# Patient Record
Sex: Male | Born: 1980 | Race: Black or African American | Hispanic: No | Marital: Married | Smoking: Former smoker
Health system: Southern US, Community
[De-identification: ages and names within clinical notes are randomized; demographics above are authoritative.]

## PROBLEM LIST (undated history)

## (undated) DIAGNOSIS — K047 Periapical abscess without sinus: Secondary | ICD-10-CM

---

## 2019-05-04 ENCOUNTER — Emergency Department
Admission: EM | Admit: 2019-05-04 | Discharge: 2019-05-04 | Disposition: A | Discharge status: Home / Self Care | Payer: No Typology Code available for payment source | Attending: Student in an Organized Health Care Education/Training Program | Admitting: Student in an Organized Health Care Education/Training Program

## 2019-05-04 ENCOUNTER — Other Ambulatory Visit: Payer: Self-pay

## 2019-05-04 ENCOUNTER — Emergency Department: Payer: No Typology Code available for payment source

## 2019-05-04 DIAGNOSIS — S20212A Contusion of left front wall of thorax, initial encounter: Secondary | ICD-10-CM | POA: Diagnosis not present

## 2019-05-04 DIAGNOSIS — M79604 Pain in right leg: Secondary | ICD-10-CM | POA: Diagnosis not present

## 2019-05-04 DIAGNOSIS — Y92481 Parking lot as the place of occurrence of the external cause: Secondary | ICD-10-CM | POA: Diagnosis not present

## 2019-05-04 DIAGNOSIS — M79609 Pain in unspecified limb: Secondary | ICD-10-CM

## 2019-05-04 DIAGNOSIS — Y9301 Activity, walking, marching and hiking: Secondary | ICD-10-CM | POA: Diagnosis not present

## 2019-05-04 DIAGNOSIS — Y999 Unspecified external cause status: Secondary | ICD-10-CM | POA: Diagnosis not present

## 2019-05-04 DIAGNOSIS — S299XXA Unspecified injury of thorax, initial encounter: Secondary | ICD-10-CM | POA: Diagnosis present

## 2019-05-04 DIAGNOSIS — F17200 Nicotine dependence, unspecified, uncomplicated: Secondary | ICD-10-CM | POA: Insufficient documentation

## 2019-05-04 MED ORDER — CYCLOBENZAPRINE HCL 10 MG PO TABS: 10.0000 mg | ORAL_TABLET | Freq: Three times a day (TID) | ORAL | 0 refills | Status: DC | PRN

## 2019-05-04 MED ORDER — NAPROXEN 500 MG PO TABS
500.0000 mg | ORAL_TABLET | Freq: Once | ORAL | Status: AC
  Administered 2019-05-04: 500 mg via ORAL
  Filled 2019-05-04: qty 1

## 2019-05-04 MED ORDER — CYCLOBENZAPRINE HCL 10 MG PO TABS
10.0000 mg | ORAL_TABLET | Freq: Once | ORAL | Status: AC
  Administered 2019-05-04: 10 mg via ORAL
  Filled 2019-05-04: qty 1

## 2019-05-04 MED ORDER — NAPROXEN 500 MG PO TABS: 500.0000 mg | ORAL_TABLET | Freq: Two times a day (BID) | ORAL | 0 refills | Status: DC
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
## 2019-05-04 NOTE — ED Notes (Signed)
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
## 2020-04-12 ENCOUNTER — Emergency Department: Payer: No Typology Code available for payment source

## 2020-04-12 ENCOUNTER — Encounter: Payer: Self-pay | Admitting: Emergency Medicine

## 2020-04-12 ENCOUNTER — Other Ambulatory Visit: Payer: Self-pay

## 2020-04-12 ENCOUNTER — Emergency Department
Admission: EM | Admit: 2020-04-12 | Discharge: 2020-04-12 | Disposition: A | Discharge status: Home / Self Care | Payer: No Typology Code available for payment source | Attending: Emergency Medicine | Admitting: Emergency Medicine

## 2020-04-12 DIAGNOSIS — R609 Edema, unspecified: Secondary | ICD-10-CM | POA: Insufficient documentation

## 2020-04-12 DIAGNOSIS — K047 Periapical abscess without sinus: Secondary | ICD-10-CM | POA: Insufficient documentation

## 2020-04-12 DIAGNOSIS — F17200 Nicotine dependence, unspecified, uncomplicated: Secondary | ICD-10-CM | POA: Diagnosis not present

## 2020-04-12 DIAGNOSIS — K0889 Other specified disorders of teeth and supporting structures: Secondary | ICD-10-CM | POA: Diagnosis present

## 2020-04-12 HISTORY — DX: Periapical abscess without sinus: K04.7

## 2020-04-12 LAB — CBC WITH DIFFERENTIAL/PLATELET
Abs Immature Granulocytes: 0.04 10*3/uL (ref 0.00–0.07)
Basophils Absolute: 0 10*3/uL (ref 0.0–0.1)
Basophils Relative: 0 %
Eosinophils Absolute: 0.1 10*3/uL (ref 0.0–0.5)
Eosinophils Relative: 0 %
HCT: 45.6 % (ref 39.0–52.0)
Hemoglobin: 15.2 g/dL (ref 13.0–17.0)
Immature Granulocytes: 0 %
Lymphocytes Relative: 24 %
Lymphs Abs: 2.8 10*3/uL (ref 0.7–4.0)
MCH: 30.5 pg (ref 26.0–34.0)
MCHC: 33.3 g/dL (ref 30.0–36.0)
MCV: 91.6 fL (ref 80.0–100.0)
Monocytes Absolute: 1.5 10*3/uL — ABNORMAL HIGH (ref 0.1–1.0)
Monocytes Relative: 13 %
Neutro Abs: 7.2 10*3/uL (ref 1.7–7.7)
Neutrophils Relative %: 63 %
Platelets: 206 10*3/uL (ref 150–400)
RBC: 4.98 MIL/uL (ref 4.22–5.81)
RDW: 13 % (ref 11.5–15.5)
WBC: 11.7 10*3/uL — ABNORMAL HIGH (ref 4.0–10.5)
nRBC: 0.3 % — ABNORMAL HIGH (ref 0.0–0.2)

## 2020-04-12 LAB — BASIC METABOLIC PANEL
Anion gap: 10 (ref 5–15)
BUN: 6 mg/dL (ref 6–20)
CO2: 26 mmol/L (ref 22–32)
Calcium: 9.1 mg/dL (ref 8.9–10.3)
Chloride: 102 mmol/L (ref 98–111)
Creatinine, Ser: 1.18 mg/dL (ref 0.61–1.24)
GFR, Estimated: >60 mL/min (ref >60)
Glucose, Bld: 79 mg/dL (ref 70–99)
Potassium: 3.7 mmol/L (ref 3.5–5.1)
Sodium: 138 mmol/L (ref 135–145)

## 2020-04-12 MED ORDER — NAPROXEN 500 MG PO TABS: 500.0000 mg | ORAL_TABLET | Freq: Two times a day (BID) | ORAL | Status: DC

## 2020-04-12 MED ORDER — LIDOCAINE VISCOUS HCL 2 % MT SOLN
15.0000 mL | Freq: Once | OROMUCOSAL | Status: AC
  Administered 2020-04-12: 15 mL via OROMUCOSAL
  Filled 2020-04-12: qty 15

## 2020-04-12 MED ORDER — NAPROXEN 500 MG PO TABS
500.0000 mg | ORAL_TABLET | Freq: Once | ORAL | Status: AC
  Administered 2020-04-12: 500 mg via ORAL
  Filled 2020-04-12: qty 1

## 2020-04-12 MED ORDER — IOHEXOL 300 MG/ML  SOLN
75.0000 mL | Freq: Once | INTRAMUSCULAR | Status: AC | PRN
  Administered 2020-04-12: 75 mL via INTRAVENOUS
  Filled 2020-04-12: qty 75

## 2020-04-12 MED ORDER — LIDOCAINE VISCOUS HCL 2 % MT SOLN: 5.0000 mL | Freq: Four times a day (QID) | OROMUCOSAL | 0 refills | Status: DC | PRN

## 2020-04-12 NOTE — Discharge Instructions (Signed)
CT did not reveal an abscess.  You have a dental infection which can be treated with oral antibiotics.  Continue previous medication and start naproxen as directed.  Follow-up with treating dentist.

## 2020-04-12 NOTE — ED Provider Notes (Signed)
[facility] Emergency Department Provider Note   ____________________________________________   Event Date/Time   First MD Initiated Contact with Patient 04/12/20 1317     (approximate)  I have reviewed the triage vital signs and the nursing notes.   HISTORY  Chief Complaint Dental Pain    HPI Eric Mata is a 39 y.o. male patient presents with dental pain and facial edema to the left anterior maxillary area.  Patient states saw dentist who started him on antibiotics yesterday.  Patient state waking this morning with increased swelling and pain.         Past Medical History:  Diagnosis Date  . Dental abscess     There are no problems to display for this patient.   History reviewed. No pertinent surgical history.  Prior to Admission medications   Medication Sig Start Date End Date Taking? Authorizing Provider  cyclobenzaprine (FLEXERIL) 10 MG tablet Take 1 tablet (10 mg total) by mouth 3 (three) times daily as needed. 05/04/19   Triplett, Cari B, FNP  lidocaine (XYLOCAINE) 2 % solution Use as directed 5 mLs in the mouth or throat every 6 (six) hours as needed for mouth pain. Oral swish for dental pain 04/12/20   Joni Reining, PA-C  naproxen (NAPROSYN) 500 MG tablet Take 1 tablet (500 mg total) by mouth 2 (two) times daily with a meal. 05/04/19   Triplett, Cari B, FNP  naproxen (NAPROSYN) 500 MG tablet Take 1 tablet (500 mg total) by mouth 2 (two) times daily with a meal. 04/12/20   Joni Reining, PA-C    Allergies Patient has no known allergies.  History reviewed. No pertinent family history.  Social History Social History   Tobacco Use  . Smoking status: Current Every Day Smoker  . Smokeless tobacco: Never Used  Substance Use Topics  . Alcohol use: Never  . Drug use: Never    Review of Systems Constitutional: No fever/chills ENT: No sore throat.  Dental pain and swelling Cardiovascular: Denies chest  pain. Respiratory: Denies shortness of breath. Gastrointestinal: No abdominal pain.  No nausea, no vomiting.  No diarrhea.  No constipation. Genitourinary: Negative for dysuria. Skin: Negative for rash. Neurological: Negative for headaches, focal weakness or numbness.   ____________________________________________   PHYSICAL EXAM:  VITAL SIGNS: ED Triage Vitals  Enc Vitals Group     BP 04/12/20 1301 (!) 141/85     Pulse Rate 04/12/20 1301 94     Resp 04/12/20 1301 16     Temp 04/12/20 1301 98.6 F (37 C)     Temp Source 04/12/20 1301 Oral     SpO2 04/12/20 1301 100 %     Weight 04/12/20 1303 165 lb (74.8 kg)     Height 04/12/20 1303 6' (1.829 m)     Head Circumference --      Peak Flow --      Pain Score 04/12/20 1303 3     Pain Loc --      Pain Edu? --      Excl. in GC? --     Constitutional: Alert and oriented. Well appearing and in no acute distress. Eyes: Conjunctivae are normal. PERRL. EOMI. Head: Atraumatic. Nose: No congestion/rhinnorhea. Mouth/Throat: Mucous membranes are moist.  Oropharynx non-erythematous.  Multiple fraction devitalized teeth.  Gingiva edema. Neck: No stridor. Hematological/Lymphatic/Immunilogical: No cervical lymphadenopathy. Cardiovascular: Normal rate, regular rhythm. Grossly normal heart sounds.  Good peripheral circulation. Respiratory: Normal respiratory effort.  No retractions. Lungs CTAB. ____________________________________________  LABS (all labs ordered are listed, but only abnormal results are displayed)  Labs Reviewed  CBC WITH DIFFERENTIAL/PLATELET - Abnormal; Notable for the following components:      Result Value   WBC 11.7 (*)    nRBC 0.3 (*)    Monocytes Absolute 1.5 (*)    All other components within normal limits  BASIC METABOLIC PANEL   ____________________________________________  EKG   ____________________________________________  RADIOLOGY I, Joni Reining, personally viewed and evaluated these images  (plain radiographs) as part of my medical decision making, as well as reviewing the written report by the radiologist.  ED MD interpretation:    Official radiology report(s): CT Maxillofacial W Contrast  Result Date: 04/12/2020 CLINICAL DATA:  Dental abscess on the left EXAM: CT MAXILLOFACIAL WITH CONTRAST TECHNIQUE: Multidetector CT imaging of the maxillofacial structures was performed with intravenous contrast. Multiplanar CT image reconstructions were also generated. CONTRAST:  51mL OMNIPAQUE IOHEXOL 300 MG/ML  SOLN COMPARISON:  None. FINDINGS: Osseous: There is been a previous mandibular fixation on the right. Plate and screws are in place without complicating feature. The patient is missing many teeth. The remaining teeth show a few scattered caries but I do not see evidence of a root abscess. No evidence lateral cortical erosion. Orbits: Normal Sinuses: Mild mucosal thickening along the maxillary sinus floors. Otherwise negative. Soft tissues: Soft tissue swelling of the left cheek and face consistent with cellulitis. There may be some early phlegmonous inflammation but I do not see a discrete soft tissue abscess. No deep space extension of inflammation. Limited intracranial: Negative IMPRESSION: Soft tissue swelling of the left cheek and face consistent with cellulitis. There may be some early phlegmonous inflammation but I do not see a discrete soft tissue abscess. No deep space extension of inflammation. Few scattered caries but no evidence of advanced periodontal disease or root abscess by CT. Electronically Signed   By: Paulina Fusi M.D.   On: 04/12/2020 15:03    ____________________________________________   PROCEDURES  Procedure(s) performed (including Critical Care):  Procedures   ____________________________________________   INITIAL IMPRESSION / ASSESSMENT AND PLAN / ED COURSE  As part of my medical decision making, I reviewed the following data within the electronic medical  record:          Patient presents with facial edema suspecting an abscess.  Discussed labs and CT results with patient.  Patient complaining physical exam consistent with cellulitis secondary to dental infection but no true abscess.  Patient advised continue previous antibiotics and start naproxen and viscous lidocaine as directed.  Follow-up with treating dentist.      ____________________________________________   FINAL CLINICAL IMPRESSION(S) / ED DIAGNOSES  Final diagnoses:  Dental infection     ED Discharge Orders         Ordered    naproxen (NAPROSYN) 500 MG tablet  2 times daily with meals        04/12/20 1515    lidocaine (XYLOCAINE) 2 % solution  Every 6 hours PRN        04/12/20 1515          *Please note:  Eric Mata was evaluated in Emergency Department on 04/12/2020 for the symptoms described in the history of present illness. He was evaluated in the context of the global COVID-19 pandemic, which necessitated consideration that the patient might be at risk for infection with the SARS-CoV-2 virus that causes COVID-19. Institutional protocols and algorithms that pertain to the evaluation of patients at risk for  COVID-19 are in a state of rapid change based on information released by regulatory bodies including the CDC and federal and state organizations. These policies and algorithms were followed during the patient's care in the ED.  Some ED evaluations and interventions may be delayed as a result of limited staffing during and the pandemic.*   Note:  This document was prepared using Dragon voice recognition software and may include unintentional dictation errors.    Joni Reining, PA-C 04/12/20 1520    Dionne Bucy, MD 04/12/20 [phone]

## 2020-04-12 NOTE — ED Triage Notes (Signed)
Pt comes into the ED via POV c/o dental abscess on the left upper side.  Pt saw the dentist who put him on antibiotics but the patient feels like it is getting worse.  Pt has clear speech and swelling to the left side of his face.  Pt has only taken 1 full day of the antibiotics.

## 2020-04-17 ENCOUNTER — Emergency Department
Admission: EM | Admit: 2020-04-17 | Discharge: 2020-04-17 | Disposition: A | Discharge status: Home / Self Care | Payer: No Typology Code available for payment source | Attending: Emergency Medicine | Admitting: Emergency Medicine

## 2020-04-17 ENCOUNTER — Encounter: Payer: Self-pay | Admitting: Emergency Medicine

## 2020-04-17 ENCOUNTER — Other Ambulatory Visit: Payer: Self-pay

## 2020-04-17 DIAGNOSIS — R220 Localized swelling, mass and lump, head: Secondary | ICD-10-CM | POA: Diagnosis present

## 2020-04-17 DIAGNOSIS — Z87891 Personal history of nicotine dependence: Secondary | ICD-10-CM | POA: Diagnosis not present

## 2020-04-17 DIAGNOSIS — K122 Cellulitis and abscess of mouth: Secondary | ICD-10-CM | POA: Insufficient documentation

## 2020-04-17 MED ORDER — KETOROLAC TROMETHAMINE 30 MG/ML IJ SOLN
30.0000 mg | Freq: Once | INTRAMUSCULAR | Status: AC
  Administered 2020-04-17: 30 mg via INTRAVENOUS
  Filled 2020-04-17: qty 1

## 2020-04-17 MED ORDER — LIDOCAINE-EPINEPHRINE 2 %-1:100000 IJ SOLN
1.7000 mL | Freq: Once | INTRAMUSCULAR | Status: AC
  Administered 2020-04-17: 1.7 mL
  Filled 2020-04-17: qty 1.7

## 2020-04-17 MED ORDER — CLINDAMYCIN PHOSPHATE 600 MG/50ML IV SOLN
600.0000 mg | Freq: Once | INTRAVENOUS | Status: AC
  Administered 2020-04-17: 600 mg via INTRAVENOUS
  Filled 2020-04-17 (×2): qty 50

## 2020-04-17 MED ORDER — CLINDAMYCIN HCL 300 MG PO CAPS: 300.0000 mg | ORAL_CAPSULE | Freq: Three times a day (TID) | ORAL | 0 refills | Status: AC

## 2020-04-17 NOTE — Discharge Instructions (Addendum)
You have had your cheek abscess drained, and got an IV dose of antibiotics in the ED today.  Apply warm compresses to the face and use salt water gargles to help promote healing.  Continue antibiotics by mouth tomorrow, and follow-up with your dental provider for ongoing management.  Return if needed.

## 2020-04-17 NOTE — ED Provider Notes (Signed)
[facility] Emergency Department Provider Note ____________________________________________  Time seen: 2244  I have reviewed the triage vital signs and the nursing notes.  HISTORY  Chief Complaint  Dental Pain   HPI Eric Mata is a 39 y.o. male presents himself to the ED for subsequent evaluation of ongoing swelling to the face and left cheek.  Patient was evaluated here 2 days ago, with a full work-up including a contrast CT study of the head and neck.  He was found to have a nonorganized phlegmon to the left cheek.  He had been started empirically  on Augmentin by cental provider, with plans on a root canal to be performed tomorrow.  Patient returned today after making phone follow-up with a dental provider.  He was told to report to the ED immediately for probable IV antibiotic  Past Medical History:  Diagnosis Date  . Dental abscess     There are no problems to display for this patient.   History reviewed. No pertinent surgical history.  Prior to Admission medications   Medication Sig Start Date End Date Taking? Authorizing Provider  clindamycin (CLEOCIN) 300 MG capsule Take 1 capsule (300 mg total) by mouth 3 (three) times daily for 10 days. 04/17/20 04/27/20  Gabrielle Mester, Charlesetta Ivory, PA-C  cyclobenzaprine (FLEXERIL) 10 MG tablet Take 1 tablet (10 mg total) by mouth 3 (three) times daily as needed. 05/04/19   Triplett, Cari B, FNP  lidocaine (XYLOCAINE) 2 % solution Use as directed 5 mLs in the mouth or throat every 6 (six) hours as needed for mouth pain. Oral swish for dental pain 04/12/20   Joni Reining, PA-C  naproxen (NAPROSYN) 500 MG tablet Take 1 tablet (500 mg total) by mouth 2 (two) times daily with a meal. 05/04/19   Triplett, Cari B, FNP  naproxen (NAPROSYN) 500 MG tablet Take 1 tablet (500 mg total) by mouth 2 (two) times daily with a meal. 04/12/20   Joni Reining, PA-C    Allergies Patient has no known allergies.  History  reviewed. No pertinent family history.  Social History Social History   Tobacco Use  . Smoking status: Former Games developer  . Smokeless tobacco: Never Used  Substance Use Topics  . Alcohol use: Never  . Drug use: Never    Review of Systems  Constitutional: Negative for fever. Eyes: Negative for visual changes. ENT: Negative for sore throat. Facial abscess as above. Cardiovascular: Negative for chest pain. Respiratory: Negative for shortness of breath. Gastrointestinal: Negative for abdominal pain, vomiting and diarrhea. Genitourinary: Negative for dysuria. Musculoskeletal: Negative for back pain. Skin: Negative for rash.   Neurological: Negative for headaches, focal weakness or numbness. ____________________________________________  PHYSICAL EXAM:  VITAL SIGNS: ED Triage Vitals  Enc Vitals Group     BP 04/17/20 2057 111/66     Pulse Rate 04/17/20 2057 60     Resp 04/17/20 2057 16     Temp 04/17/20 2057 98.9 F (37.2 C)     Temp Source 04/17/20 2057 Oral     SpO2 04/17/20 2057 100 %     Weight 04/17/20 2058 170 lb (77.1 kg)     Height 04/17/20 2058 6' (1.829 m)     Head Circumference --      Peak Flow --      Pain Score 04/17/20 2058 4     Pain Loc --      Pain Edu? --      Excl. in GC? --  Constitutional: Alert and oriented. Well appearing and in no distress. Head: Normocephalic and atraumatic. Eyes: Conjunctivae are normal. Normal extraocular movements Mouth/Throat: Mucous membranes are moist.  Patient with no focal gumline swelling or erythema noted.  No brawny sublingual edema is appreciated.  Patient with a focal firm well collected abscess to the left cheek.  No extension or spontaneous drainage into the mouth is noted. Neck: Supple. No thyromegaly. Hematological/Lymphatic/Immunological: No cervical lymphadenopathy. Cardiovascular: Normal rate, regular rhythm. Normal distal pulses. Respiratory: Normal respiratory effort. No  wheezes/rales/rhonchi. Musculoskeletal: Nontender with normal range of motion in all extremities.  Neurologic:  Normal gait without ataxia. Normal speech and language. No gross focal neurologic deficits are appreciated. Skin:  Skin is warm, dry and intact. No rash noted. Psychiatric: Mood and affect are normal. Patient exhibits appropriate insight and judgment. ____________________________________________   LABS (pertinent positives/negatives)  Labs Reviewed  AEROBIC/ANAEROBIC CULTURE (SURGICAL/DEEP WOUND)  ____________________________________________  PROCEDURES  Toradol 30 mg IVP Clindamycin 600 mg IVPB  .Marland KitchenIncision and Drainage  Date/Time: 04/17/2020 11:25 PM Performed by: Lissa Hoard, PA-C Authorized by: Lissa Hoard, PA-C   Consent:    Consent obtained:  Verbal   Consent given by:  Patient   Risks discussed:  Bleeding, pain and incomplete drainage   Alternatives discussed:  Alternative treatment Universal protocol:    Procedure explained and questions answered to patient or proxy's satisfaction: yes     Patient identity confirmed:  Verbally with patient Location:    Type:  Abscess   Location:  Mouth   Mouth location: buccak. Sedation:    Sedation type:  None Anesthesia:    Anesthesia method:  Local infiltration   Local anesthetic:  Lidocaine 2% WITH epi Procedure type:    Complexity:  Simple Procedure details:    Needle aspiration: yes     Needle size:  18 G   Drainage:  Purulent   Drainage amount:  Moderate   Wound treatment:  Wound left open Post-procedure details:    Procedure completion:  Tolerated well, no immediate complications   ____________________________________________  INITIAL IMPRESSION / ASSESSMENT AND PLAN / ED COURSE  Patient with subsequent ED evaluation and management of a focal buccal abscess.  Patient returned at the advice of dental provider for fast forming focal abscess in the cheek.  He was given IV  clindamycin in the ED, and a local needle aspiration procedure was performed.  Patient will be discharged with a prescription for clindamycin to take as directed.  Follow-up with his dental provider for further management of his planned root canal.  Return precautions have been discussed.  Eric Mata was evaluated in Emergency Department on 04/17/2020 for the symptoms described in the history of present illness. He was evaluated in the context of the global COVID-19 pandemic, which necessitated consideration that the patient might be at risk for infection with the SARS-CoV-2 virus that causes COVID-19. Institutional protocols and algorithms that pertain to the evaluation of patients at risk for COVID-19 are in a state of rapid change based on information released by regulatory bodies including the CDC and federal and state organizations. These policies and algorithms were followed during the patient's care in the ED. ____________________________________________  FINAL CLINICAL IMPRESSION(S) / ED DIAGNOSES  Final diagnoses:  Abscess of buccal space of mouth      Karmen Stabs, Charlesetta Ivory, PA-C 04/17/20 2344    Phineas Semen, MD 04/21/20 [phone]

## 2020-04-17 NOTE — ED Triage Notes (Signed)
Pt to ED c/o dental pain left upper mouth.  States was scheduled for root canal tomorrow.  Was started on ABX by dentist augemtin 12/8 and told to return if swelling became worse.  Came to ED the next day and prescribed naproxen.  States back today because swelling getting worse to left upper mouth and cheek and told to have IV ABX.  Pt with clear speech, swelling to left upper mouth, maintaining secretions.  No protocols at this time per Dr. Derrill Kay.

## 2020-04-24 LAB — AEROBIC/ANAEROBIC CULTURE W GRAM STAIN (SURGICAL/DEEP WOUND)

## 2020-11-12 ENCOUNTER — Telehealth: Payer: Self-pay | Admitting: Internal Medicine

## 2020-11-12 ENCOUNTER — Ambulatory Visit: Payer: No Typology Code available for payment source | Admitting: Internal Medicine

## 2020-11-12 ENCOUNTER — Other Ambulatory Visit: Payer: Self-pay

## 2020-11-12 NOTE — Telephone Encounter (Signed)
Pts wife Javonni Macke is calling to place a complaint against [employer] Please advise CB- [phone]

## 2020-11-19 NOTE — Telephone Encounter (Signed)
Spoke with patient's wife.

## 2021-01-12 ENCOUNTER — Encounter: Payer: Self-pay | Admitting: Emergency Medicine

## 2021-01-12 ENCOUNTER — Emergency Department
Admission: EM | Admit: 2021-01-12 | Discharge: 2021-01-12 | Disposition: A | Discharge status: Home / Self Care | Payer: No Typology Code available for payment source | Attending: Emergency Medicine | Admitting: Emergency Medicine

## 2021-01-12 ENCOUNTER — Other Ambulatory Visit: Payer: Self-pay

## 2021-01-12 DIAGNOSIS — M549 Dorsalgia, unspecified: Secondary | ICD-10-CM | POA: Diagnosis not present

## 2021-01-12 DIAGNOSIS — M79602 Pain in left arm: Secondary | ICD-10-CM | POA: Diagnosis not present

## 2021-01-12 DIAGNOSIS — Y92410 Unspecified street and highway as the place of occurrence of the external cause: Secondary | ICD-10-CM | POA: Diagnosis not present

## 2021-01-12 DIAGNOSIS — Z87891 Personal history of nicotine dependence: Secondary | ICD-10-CM | POA: Diagnosis not present

## 2021-01-12 MED ORDER — NAPROXEN 500 MG PO TABS: 500.0000 mg | ORAL_TABLET | Freq: Two times a day (BID) | ORAL | Status: DC

## 2021-01-12 MED ORDER — ORPHENADRINE CITRATE ER 100 MG PO TB12: 100.0000 mg | ORAL_TABLET | Freq: Two times a day (BID) | ORAL | 0 refills | Status: DC

## 2021-01-12 MED ORDER — OXYCODONE-ACETAMINOPHEN 7.5-325 MG PO TABS: 1.0000 | ORAL_TABLET | Freq: Four times a day (QID) | ORAL | 0 refills | Status: DC | PRN
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
## 2021-01-12 NOTE — Discharge Instructions (Signed)
Read and follow discharge care instructions.  Take medication as directed.  Advised medication may cause drowsiness.

## 2021-09-27 ENCOUNTER — Other Ambulatory Visit: Payer: Self-pay

## 2021-09-27 ENCOUNTER — Emergency Department: Payer: No Typology Code available for payment source

## 2021-09-27 ENCOUNTER — Inpatient Hospital Stay
Admission: EM | Admit: 2021-09-27 | Discharge: 2021-09-30 | DRG: 158 | Disposition: A | Discharge status: Home / Self Care | Payer: No Typology Code available for payment source | Attending: Internal Medicine | Admitting: Internal Medicine

## 2021-09-27 DIAGNOSIS — L03211 Cellulitis of face: Secondary | ICD-10-CM | POA: Diagnosis present

## 2021-09-27 DIAGNOSIS — Z87891 Personal history of nicotine dependence: Secondary | ICD-10-CM | POA: Diagnosis not present

## 2021-09-27 DIAGNOSIS — K083 Retained dental root: Secondary | ICD-10-CM | POA: Diagnosis present

## 2021-09-27 DIAGNOSIS — R220 Localized swelling, mass and lump, head: Secondary | ICD-10-CM | POA: Diagnosis not present

## 2021-09-27 DIAGNOSIS — F17210 Nicotine dependence, cigarettes, uncomplicated: Secondary | ICD-10-CM | POA: Diagnosis not present

## 2021-09-27 DIAGNOSIS — K047 Periapical abscess without sinus: Secondary | ICD-10-CM | POA: Diagnosis present

## 2021-09-27 DIAGNOSIS — F17200 Nicotine dependence, unspecified, uncomplicated: Secondary | ICD-10-CM | POA: Diagnosis present

## 2021-09-27 DIAGNOSIS — L0201 Cutaneous abscess of face: Secondary | ICD-10-CM | POA: Diagnosis present

## 2021-09-27 LAB — CBC WITH DIFFERENTIAL/PLATELET
Abs Immature Granulocytes: 0.03 10*3/uL (ref 0.00–0.07)
Basophils Absolute: 0 10*3/uL (ref 0.0–0.1)
Basophils Relative: 0 %
Eosinophils Absolute: 0.5 10*3/uL (ref 0.0–0.5)
Eosinophils Relative: 3 %
HCT: 44.1 % (ref 39.0–52.0)
Hemoglobin: 14.4 g/dL (ref 13.0–17.0)
Immature Granulocytes: 0 %
Lymphocytes Relative: 28 %
Lymphs Abs: 3.8 10*3/uL (ref 0.7–4.0)
MCH: 30.4 pg (ref 26.0–34.0)
MCHC: 32.7 g/dL (ref 30.0–36.0)
MCV: 93 fL (ref 80.0–100.0)
Monocytes Absolute: 1.4 10*3/uL — ABNORMAL HIGH (ref 0.1–1.0)
Monocytes Relative: 11 %
Neutro Abs: 7.8 10*3/uL — ABNORMAL HIGH (ref 1.7–7.7)
Neutrophils Relative %: 58 %
Platelets: 240 10*3/uL (ref 150–400)
RBC: 4.74 MIL/uL (ref 4.22–5.81)
RDW: 13.2 % (ref 11.5–15.5)
WBC: 13.6 10*3/uL — ABNORMAL HIGH (ref 4.0–10.5)
nRBC: 0 % (ref 0.0–0.2)

## 2021-09-27 LAB — BASIC METABOLIC PANEL
Anion gap: 8 (ref 5–15)
BUN: 12 mg/dL (ref 6–20)
CO2: 26 mmol/L (ref 22–32)
Calcium: 9.2 mg/dL (ref 8.9–10.3)
Chloride: 100 mmol/L (ref 98–111)
Creatinine, Ser: 1.29 mg/dL — ABNORMAL HIGH (ref 0.61–1.24)
GFR, Estimated: >60 mL/min (ref >60)
Glucose, Bld: 125 mg/dL — ABNORMAL HIGH (ref 70–99)
Potassium: 3.7 mmol/L (ref 3.5–5.1)
Sodium: 134 mmol/L — ABNORMAL LOW (ref 135–145)

## 2021-09-27 IMAGING — CT CT MAXILLOFACIAL W/ CM
3 series · 14 of 47 positions shown, 16 images · IV contrast (agent unspecified)
Comparison: None Available.

CLINICAL DATA: Left maxillary dental abscess

EXAM:
CT MAXILLOFACIAL WITH CONTRAST
TECHNIQUE: Multidetector CT imaging of the maxillofacial structures was
performed with intravenous contrast. Multiplanar CT image
reconstructions were also generated.

[Series 2: max soft · axial · 0.37mm/px · z∈[-254,-112]mm · 8 of 83 slices shown, 10 images]
[im 6/83  brain]
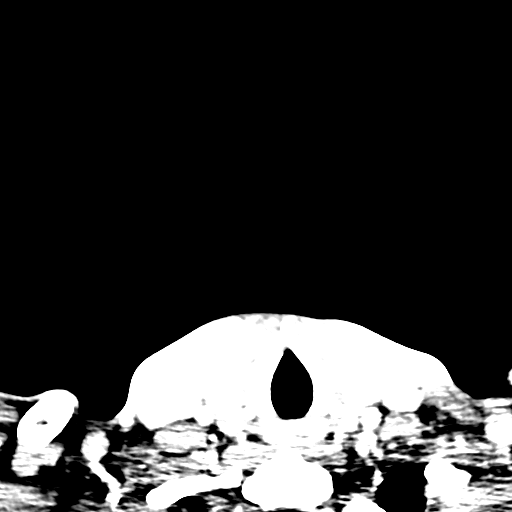
[im 6/83  bone]
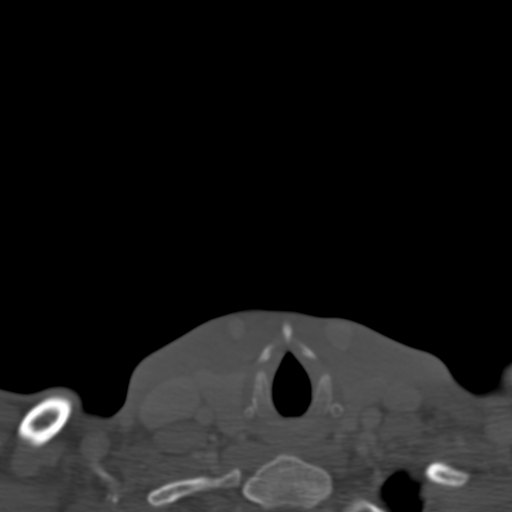
[im 17/83  bone]
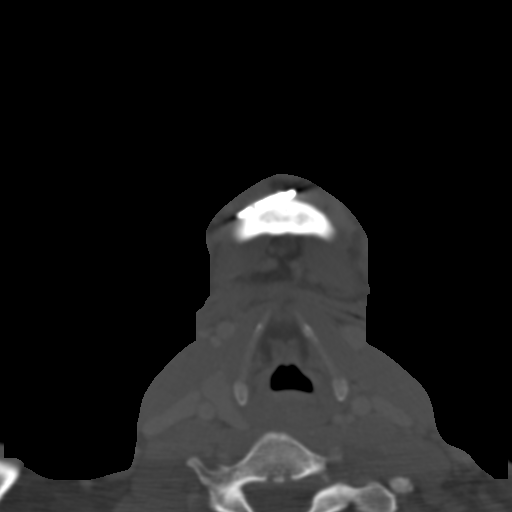
[im 26/83  bone]
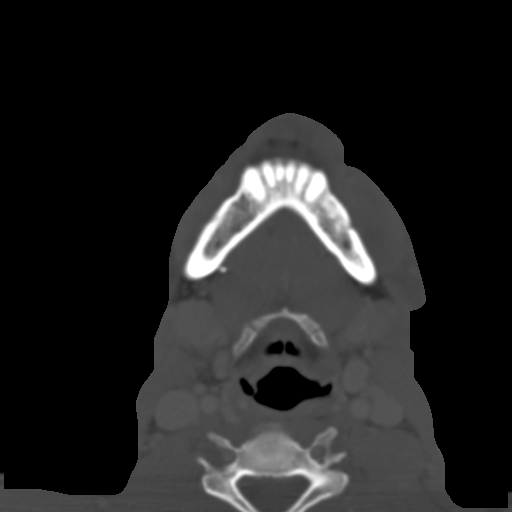
[im 37/83  bone]
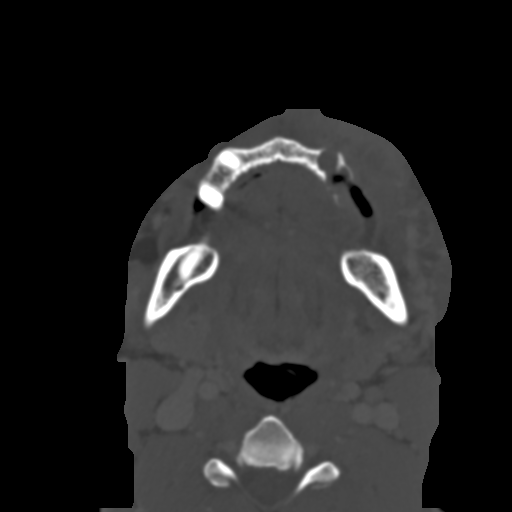
[im 46/83  brain]
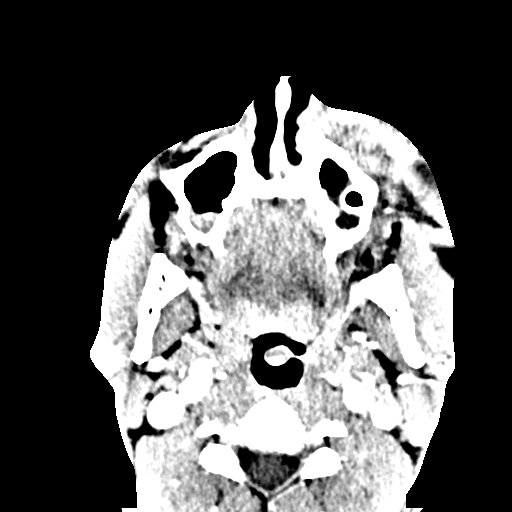
[im 46/83  bone]
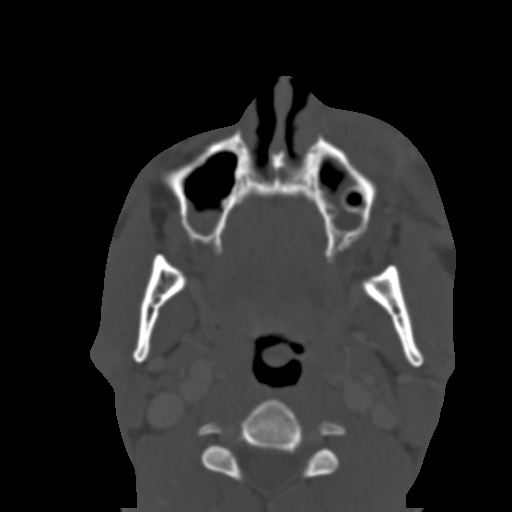
[im 57/83  bone]
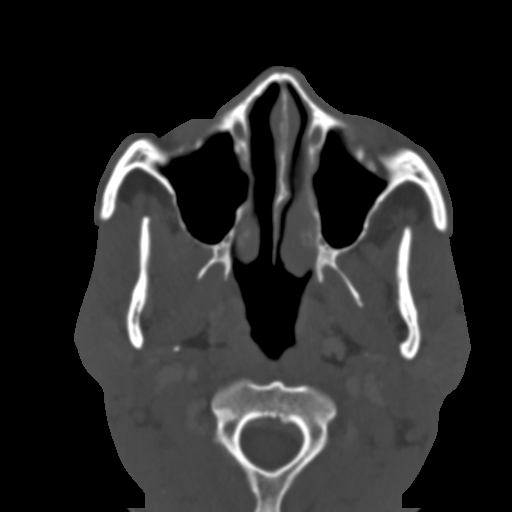
[im 66/83  bone]
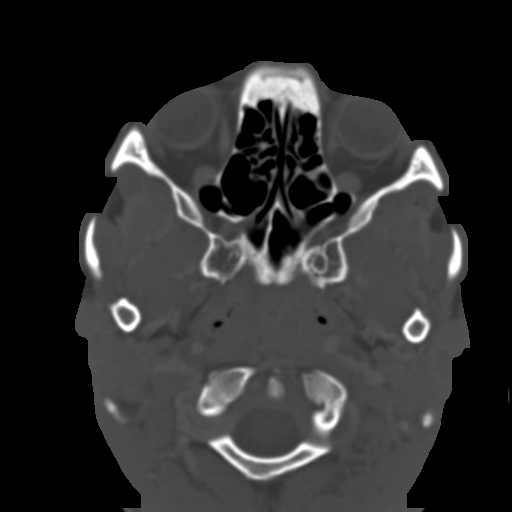
[im 77/83  bone]
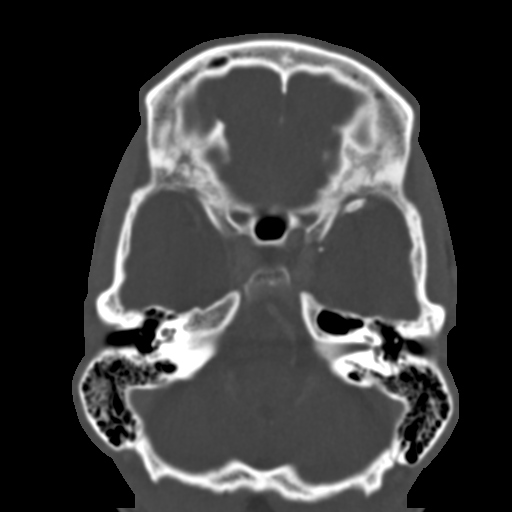

[Series 4: coronal soft · coronal · 0.40mm/px · 3 of 128 slices shown]
[im 43/128  bone]
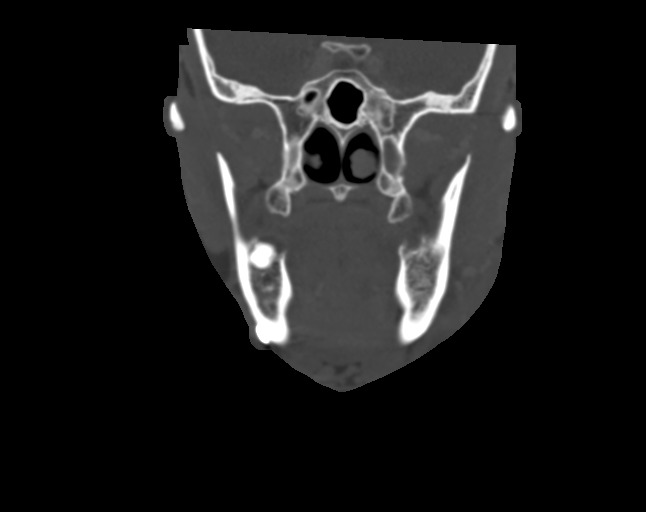
[im 57/128  bone]
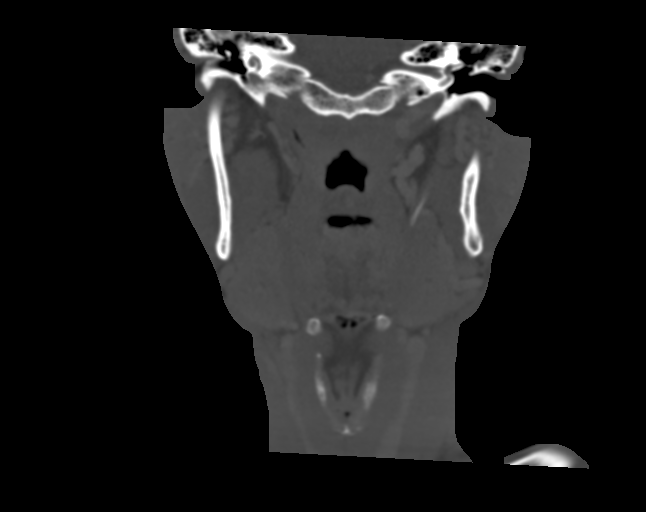
[im 71/128  bone]
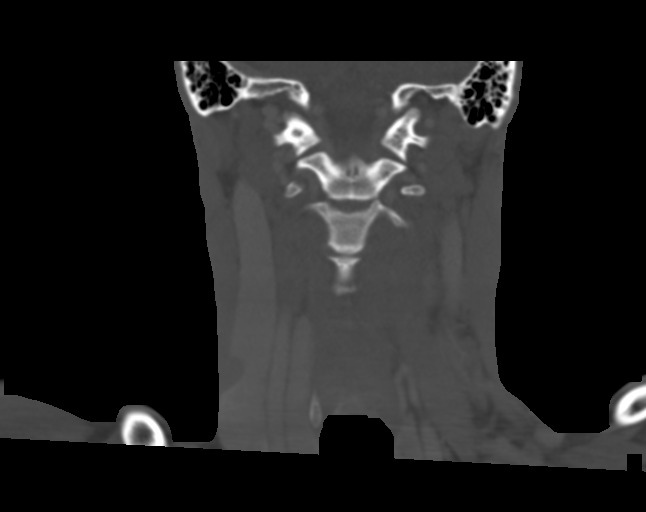

[Series 5: sagittal soft · sagittal · 0.34mm/px · 3 of 139 slices shown]
[im 47/139  bone]
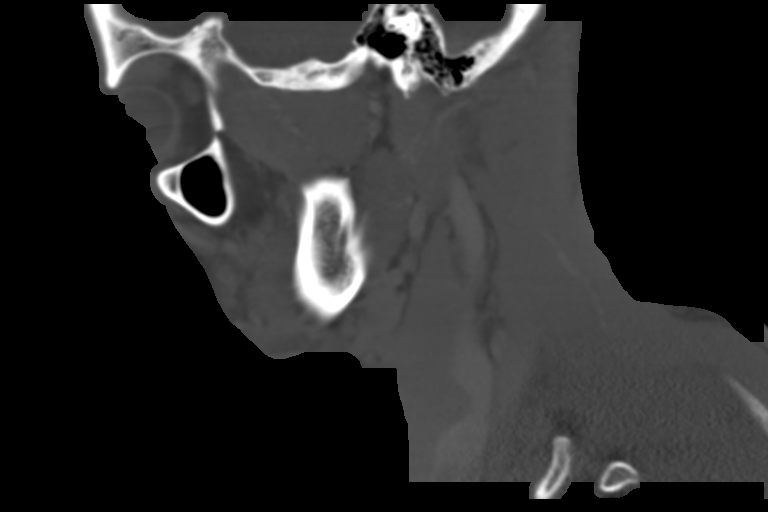
[im 70/139  bone]
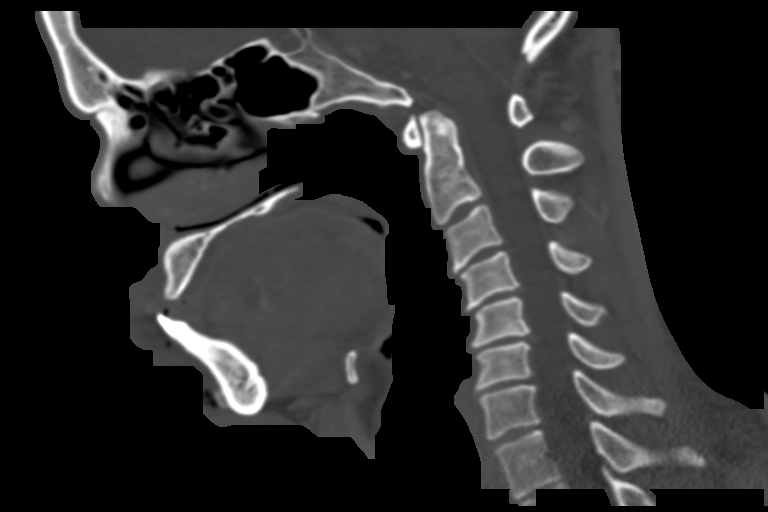
[im 93/139  bone]
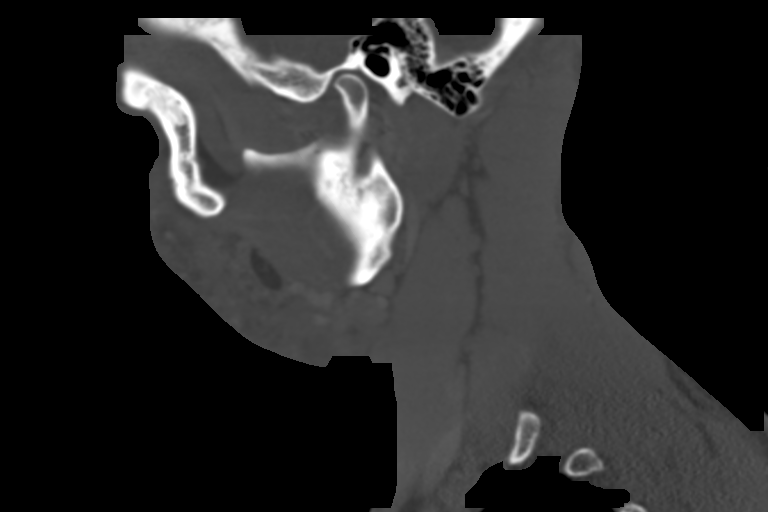

[14 of 47 positions shown; findings below may reference images not displayed]

RADIATION DOSE REDUCTION: This exam was performed according to the
departmental dose-optimization program which includes automated
exposure control, adjustment of the mA and/or kV according to
patient size and/or use of iterative reconstruction technique.

CONTRAST:  75mL OMNIPAQUE IOHEXOL 300 MG/ML  SOLN
FINDINGS: Osseous: Dental caries and periapical erosions. Prior right mandible
fracture with fixating plate. Two recent left maxillary extractions
with the more anterior showing a retained tooth root measuring 4 mm
adjacent to a broad dehiscence along the labial aspect which is
complicated by cellulitis with 2.3 cm left cheek abscess.

Orbits: No orbital extension of infection.

Sinuses: Trace mucosal thickening along the floors of the maxillary
sinuses, possibly reactive and odontogenic. The more posterior left
maxillary extraction site has an alveolus that appears nearly
dehiscent into the left maxillary sinus.

Soft tissues: Left cheek cellulitis and abscess as noted above.

Limited intracranial: Negative
IMPRESSION: Odontogenic cellulitis with 2.3 cm abscess in the left cheek,
contiguous with the more anterior dental extraction site which is
notable for a 4 mm retained tooth root.

## 2021-09-27 MED ORDER — ONDANSETRON HCL 4 MG PO TABS: 4.0000 mg | ORAL_TABLET | Freq: Four times a day (QID) | ORAL | Status: DC | PRN

## 2021-09-27 MED ORDER — ONDANSETRON HCL 4 MG/2ML IJ SOLN: 4.0000 mg | Freq: Four times a day (QID) | INTRAMUSCULAR | Status: DC | PRN

## 2021-09-27 MED ORDER — SODIUM CHLORIDE 0.9 % IV SOLN
3.0000 g | Freq: Four times a day (QID) | INTRAVENOUS | Status: DC
  Administered 2021-09-27 – 2021-09-29 (×8): 3 g via INTRAVENOUS
  Filled 2021-09-27: qty 8
  Filled 2021-09-27 (×2): qty 3
  Filled 2021-09-27 (×5): qty 8
  Filled 2021-09-27: qty 3

## 2021-09-27 MED ORDER — ACETAMINOPHEN 325 MG PO TABS: 650.0000 mg | ORAL_TABLET | Freq: Four times a day (QID) | ORAL | Status: DC | PRN

## 2021-09-27 MED ORDER — NICOTINE 14 MG/24HR TD PT24
14.0000 mg | MEDICATED_PATCH | Freq: Every day | TRANSDERMAL | Status: DC
  Administered 2021-09-27 – 2021-09-30 (×4): 14 mg via TRANSDERMAL
  Filled 2021-09-27 (×4): qty 1

## 2021-09-27 MED ORDER — MORPHINE SULFATE (PF) 4 MG/ML IV SOLN
4.0000 mg | Freq: Once | INTRAVENOUS | Status: AC
  Administered 2021-09-27: 4 mg via INTRAVENOUS
  Filled 2021-09-27: qty 1

## 2021-09-27 MED ORDER — KETOROLAC TROMETHAMINE 15 MG/ML IJ SOLN
15.0000 mg | Freq: Four times a day (QID) | INTRAMUSCULAR | Status: DC | PRN
Start: 2021-09-27 — End: 2021-09-30
  Administered 2021-09-27 (×2): 15 mg via INTRAVENOUS
  Filled 2021-09-27 (×2): qty 1

## 2021-09-27 MED ORDER — ACETAMINOPHEN 650 MG RE SUPP: 650.0000 mg | Freq: Four times a day (QID) | RECTAL | Status: DC | PRN

## 2021-09-27 MED ORDER — SODIUM CHLORIDE 0.9 % IV SOLN: INTRAVENOUS | Status: AC

## 2021-09-27 MED ORDER — MORPHINE SULFATE (PF) 2 MG/ML IV SOLN
2.0000 mg | INTRAVENOUS | Status: DC | PRN
  Administered 2021-09-27: 2 mg via INTRAVENOUS
  Filled 2021-09-27: qty 1

## 2021-09-27 MED ORDER — SODIUM CHLORIDE 0.9 % IV SOLN
3.0000 g | Freq: Once | INTRAVENOUS | Status: AC
  Administered 2021-09-27: 3 g via INTRAVENOUS
  Filled 2021-09-27: qty 8

## 2021-09-27 MED ORDER — IOHEXOL 300 MG/ML  SOLN
75.0000 mL | Freq: Once | INTRAMUSCULAR | Status: AC | PRN
  Administered 2021-09-27: 75 mL via INTRAVENOUS

## 2021-09-27 NOTE — ED Provider Notes (Signed)
[facility] Provider Note    Event Date/Time   First MD Initiated Contact with Patient 09/27/21 [phone]     (approximate)   History   Facial Swelling   HPI  Eric Mata is a 41 y.o. male who presents to the ED for evaluation of Facial Swelling   Patient presents to the ED for evaluation of 2 days of increasing left-sided maxillary swelling after having a tooth pulled this past Tuesday.  Reports following up with the dentist earlier in the day on Thursday and being prescribed penicillin, which he took 2 doses so far.  Reports worsening swelling, pain despite the penicillin.  Reports feeling subjective fevers and chills and nauseous as well.  He reports fear associated with his father dying from an untreated similar periodontal infection.   Physical Exam   Triage Vital Signs: ED Triage Vitals  Enc Vitals Group     BP 09/27/21 0346 (!) 149/100     Pulse Rate 09/27/21 0346 64     Resp 09/27/21 0346 17     Temp 09/27/21 0346 97.7 F (36.5 C)     Temp Source 09/27/21 0346 Oral     SpO2 09/27/21 0346 98 %     Weight --      Height 09/27/21 0341 6\' 1"  (1.854 m)     Head Circumference --      Peak Flow --      Pain Score 09/27/21 0341 10     Pain Loc --      Pain Edu? --      Excl. in GC? --     Most recent vital signs: Vitals:   09/27/21 0530 09/27/21 0535  BP: 133/83   Pulse: 65 65  Resp: 16   Temp:    SpO2: 96% 94%    General: Awake, no distress.  Obvious left-sided maxillary swelling without overlying skin changes or signs of external trauma.  No signs of EOM entrapment or upper airway obstruction.  No firmness to the floor of the mouth or swelling beneath the tongue. Poor dentition intraorally.  Uvula is midline. CV:  Good peripheral perfusion.  Resp:  Normal effort.  Abd:  No distention.  MSK:  No deformity noted.  Neuro:  No focal deficits appreciated. Other:     ED Results / Procedures / Treatments   Labs (all labs  ordered are listed, but only abnormal results are displayed) Labs Reviewed  CBC WITH DIFFERENTIAL/PLATELET - Abnormal; Notable for the following components:      Result Value   WBC 13.6 (*)    Neutro Abs 7.8 (*)    Monocytes Absolute 1.4 (*)    All other components within normal limits  BASIC METABOLIC PANEL - Abnormal; Notable for the following components:   Sodium 134 (*)    Glucose, Bld 125 (*)    Creatinine, Ser 1.29 (*)    All other components within normal limits    EKG   RADIOLOGY CT maxillary interpreted by me with a 2.2 cm left-sided maxillary periodontal abscess  Official radiology report(s): CT Maxillofacial W Contrast  Result Date: 09/27/2021 CLINICAL DATA:  Left maxillary dental abscess EXAM: CT MAXILLOFACIAL WITH CONTRAST TECHNIQUE: Multidetector CT imaging of the maxillofacial structures was performed with intravenous contrast. Multiplanar CT image reconstructions were also generated. RADIATION DOSE REDUCTION: This exam was performed according to the departmental dose-optimization program which includes automated exposure control, adjustment of the mA and/or kV according to patient size and/or use of  iterative reconstruction technique. CONTRAST:  54mL OMNIPAQUE IOHEXOL 300 MG/ML  SOLN COMPARISON:  None Available. FINDINGS: Osseous: Dental caries and periapical erosions. Prior right mandible fracture with fixating plate. Two recent left maxillary extractions with the more anterior showing a retained tooth root measuring 4 mm adjacent to a broad dehiscence along the labial aspect which is complicated by cellulitis with 2.3 cm left cheek abscess. Orbits: No orbital extension of infection. Sinuses: Trace mucosal thickening along the floors of the maxillary sinuses, possibly reactive and odontogenic. The more posterior left maxillary extraction site has an alveolus that appears nearly dehiscent into the left maxillary sinus. Soft tissues: Left cheek cellulitis and abscess as noted  above. Limited intracranial: Negative IMPRESSION: Odontogenic cellulitis with 2.3 cm abscess in the left cheek, contiguous with the more anterior dental extraction site which is notable for a 4 mm retained tooth root. Electronically Signed   By: Tiburcio Pea M.D.   On: 09/27/2021 05:11    PROCEDURES and INTERVENTIONS:  .1-3 Lead EKG Interpretation Performed by: Delton Prairie, MD Authorized by: Delton Prairie, MD     Interpretation: normal     ECG rate:  70   ECG rate assessment: normal     Rhythm: sinus rhythm     Ectopy: none     Conduction: normal    Medications  Ampicillin-Sulbactam (UNASYN) 3 g in sodium chloride 0.9 % 100 mL IVPB (0 g Intravenous Stopped 09/27/21 0523)  morphine (PF) 4 MG/ML injection 4 mg (4 mg Intravenous Given 09/27/21 0412)  iohexol (OMNIPAQUE) 300 MG/ML solution 75 mL (75 mLs Intravenous Contrast Given 09/27/21 0456)     IMPRESSION / MDM / ASSESSMENT AND PLAN / ED COURSE  I reviewed the triage vital signs and the nursing notes.  Differential diagnosis includes, but is not limited to, sepsis, cellulitis, periodontal abscess, Ludewig's angina  {Patient presents with symptoms of an acute illness or injury that is potentially life-threatening.  41 year old male presents to the ED with atraumatic left-sided maxillary facial swelling with evidence of a periodontal abscess requiring IV antibiotics.  No evidence of sepsis or systemic illness, but he does report having some symptoms of subjective chills and fevers more recently.  He does have a white count of 13,000, otherwise reassuring metabolic panel.  Not meeting sepsis or SIRS criteria.  CT obtained due to his fairly dramatic swelling on examination that does demonstrate a 2.2 cm abscess.  I am unable to reach this from intraoral route for I&D.  We will consult with medicine for obs admission  Clinical Course as of 09/27/21 0558  Fri Sep 27, 2021  0540 Reassessed after CT scan.  Patient reports feeling maybe  little bit better.  We discussed abscess and I reexamined him.  His abscess is fairly superior and not obvious from an intraoral perspective.  I do not feel comfortable with incision and drainage at the bedside.  We discussed disposition options and he would prefer to stay for IV antibiotics.Marland Kitchen He shares with me that his father had a similar periodontal infection, but he stayed at home and did not seek help, and then died from it 2 days later [DS]    Clinical Course User Index [DS] Delton Prairie, MD     FINAL CLINICAL IMPRESSION(S) / ED DIAGNOSES   Final diagnoses:  Dental abscess  Facial swelling  Left facial swelling     Rx / DC Orders   ED Discharge Orders     None  Note:  This document was prepared using Dragon voice recognition software and may include unintentional dictation errors.   Delton Prairie, MD 09/27/21 [phone]

## 2021-09-27 NOTE — Progress Notes (Signed)
Admission profile updated. ?

## 2021-09-27 NOTE — Consult Note (Signed)
Shloimy, Michalski [id number] June 11, 1980 Sandi Mealy, MD  Reason for Consult: Dental abscess Requesting Physician: Lucile Shutters, MD Consulting Physician: Sandi Mealy, MD  HPI: This 41 y.o. year old male was admitted on 09/27/2021 for Dental abscess [K04.7] Facial swelling [R22.0] Left facial swelling [R22.0] Facial cellulitis [L03.211]. Amauri Segundo Makela is a 41 y.o. male with medical history significant for nicotine dependence who presents to the ER for evaluation of swelling and pain involving the left side of his face.  Patient states that he had a dental abscess and was placed on antibiotics by his dentist which he completed.  He is status post extraction of multiple teeth on the upper left maxillary area which was done on 09/23/21.  He states that 24 hours post extraction he developed swelling involving the left side of his face.  He did not think anything of it and attributed it to the recent procedure that he had.  He saw his dentist again 24 hours prior to admission who prescribed him penicillin and advised that he go to the ER if his symptoms worsened.  After 2 doses of penicillin he felt he was worsening and presented to the emergency room. He had some fever and chills at home, but was not febrile when he arrived in the ER.  He does feel some better after admission and starting IV antibiotics.   Allergies: No Known Allergies  Medications:  Medications Prior to Admission  Medication Sig Dispense Refill   acetaminophen-codeine (TYLENOL #3) 300-30 MG tablet Take 1 tablet by mouth every 4 (four) hours as needed for pain.    .  Current Facility-Administered Medications  Medication Dose Route Frequency Provider Last Rate Last Admin   0.9 %  sodium chloride infusion   Intravenous Continuous Agbata, Tochukwu, MD 100 mL/hr at 09/27/21 0944 New Bag at 09/27/21 0944   acetaminophen (TYLENOL) tablet 650 mg  650 mg Oral Q6H PRN Agbata, Tochukwu, MD       Or   acetaminophen (TYLENOL)  suppository 650 mg  650 mg Rectal Q6H PRN Agbata, Tochukwu, MD       Ampicillin-Sulbactam (UNASYN) 3 g in sodium chloride 0.9 % 100 mL IVPB  3 g Intravenous Q6H Agbata, Tochukwu, MD 200 mL/hr at 09/27/21 0946 3 g at 09/27/21 0946   ketorolac (TORADOL) 15 MG/ML injection 15 mg  15 mg Intravenous Q6H PRN Agbata, Tochukwu, MD   15 mg at 09/27/21 1116   morphine (PF) 2 MG/ML injection 2 mg  2 mg Intravenous Q2H PRN Andris Baumann, MD   2 mg at 09/27/21 0746   nicotine (NICODERM CQ - dosed in mg/24 hours) patch 14 mg  14 mg Transdermal Daily Agbata, Tochukwu, MD   14 mg at 09/27/21 0940   ondansetron (ZOFRAN) tablet 4 mg  4 mg Oral Q6H PRN Agbata, Tochukwu, MD       Or   ondansetron (ZOFRAN) injection 4 mg  4 mg Intravenous Q6H PRN Agbata, Tochukwu, MD        PMH:  Past Medical History:  Diagnosis Date   Dental abscess     Fam Hx: History reviewed. No pertinent family history.  Soc Hx:  Social History   Socioeconomic History   Marital status: Married    Spouse name: Not on file   Number of children: Not on file   Years of education: Not on file   Highest education level: Not on file  Occupational History   Not on file  Tobacco Use  Smoking status: Former   Smokeless tobacco: Never  Building services engineer Use: Never used  Substance and Sexual Activity   Alcohol use: Never   Drug use: Never   Sexual activity: Not on file  Other Topics Concern   Not on file  Social History Narrative   Not on file   Social Determinants of Health   Financial Resource Strain: Not on file  Food Insecurity: Not on file  Transportation Needs: Not on file  Physical Activity: Not on file  Stress: Not on file  Social Connections: Not on file  Intimate Partner Violence: Not on file    PSH: History reviewed. No pertinent surgical history.. Procedures since admission: No admission procedures for hospital encounter.  ROS: Review of systems normal other than 12 systems except per HPI.  PHYSICAL  EXAM Vitals:  Vitals:   09/27/21 0657 09/27/21 0808  BP: (!) 141/91 133/90  Pulse: (!) 57 63  Resp: 19 16  Temp: 99.3 F (37.4 C) 98.4 F (36.9 C)  SpO2: 100% 100%  .  General: Well-developed, Well-nourished in no acute distress Mood: Mood and affect well adjusted, pleasant and cooperative. Orientation: Grossly alert and oriented. Vocal Quality: No hoarseness. Communicates verbally. head and Face: NCAT. No facial asymmetry. No visible skin lesions. No significant facial scars.  There is firmness and swelling over the left cheek, slightly fluctuant and tender.   Ears: External ears with normal landmarks, no lesions. External auditory canals free of infection, cerumen impaction or lesions. Tympanic membranes intact with good landmarks and normal mobility on pneumatic otoscopy. No middle ear effusion. Hearing: Speech reception grossly normal. Nose: External nose normal with midline dorsum and no lesions or deformity. Nasal Cavity reveals essentially midline septum with normal inferior turbinates. No significant mucosal congestion or erythema. Nasal secretions are minimal and clear. No polyps seen on anterior rhinoscopy. Oral Cavity/ Oropharynx: Lips are normal with no lesions.  Poor dentition with the dental extraction site on the left maxillary gingival ridge with scant purulent discharge.  There is edema of the left upper lip and gingivobuccal sulcus.  Tongue and floor mouth are without swelling the posterior pharynx is clear.  Indirect Laryngoscopy/Nasopharyngoscopy: Visualization of the larynx, hypopharynx and nasopharynx is not possible in this setting with routine examination. Neck: Supple and symmetric with no palpable masses, tenderness or crepitance. The trachea is midline. Thyroid gland is soft, nontender and symmetric with no masses or enlargement. Parotid and submandibular glands are soft, nontender and symmetric, without masses. Lymphatic: No significant bulky adenopathy is  palpable. Respiratory: Normal respiratory effort without labored breathing. Cardiovascular: Carotid pulse shows regular rate and rhythm Neurologic: Cranial Nerves II through XII are grossly intact. Eyes: Gaze and Ocular Motility are grossly normal. PERRLA. No visible nystagmus.  MEDICAL DECISION MAKING: Data Review:  Results for orders placed or performed during the hospital encounter of 09/27/21 (from the past 48 hour(s))  CBC with Differential/Platelet     Status: Abnormal   Collection Time: 09/27/21  4:08 AM  Result Value Ref Range   WBC 13.6 (H) 4.0 - 10.5 K/uL   RBC 4.74 4.22 - 5.81 MIL/uL   Hemoglobin 14.4 13.0 - 17.0 g/dL   HCT 05.3 97.6 - 73.4 %   MCV 93.0 80.0 - 100.0 fL   MCH 30.4 26.0 - 34.0 pg   MCHC 32.7 30.0 - 36.0 g/dL   RDW 19.3 79.0 - 24.0 %   Platelets 240 150 - 400 K/uL   nRBC 0.0 0.0 - 0.2 %  Neutrophils Relative % 58 %   Neutro Abs 7.8 (H) 1.7 - 7.7 K/uL   Lymphocytes Relative 28 %   Lymphs Abs 3.8 0.7 - 4.0 K/uL   Monocytes Relative 11 %   Monocytes Absolute 1.4 (H) 0.1 - 1.0 K/uL   Eosinophils Relative 3 %   Eosinophils Absolute 0.5 0.0 - 0.5 K/uL   Basophils Relative 0 %   Basophils Absolute 0.0 0.0 - 0.1 K/uL   Immature Granulocytes 0 %   Abs Immature Granulocytes 0.03 0.00 - 0.07 K/uL    Comment: Performed at [facility], [address], [place], [place] [address]  Basic metabolic panel     Status: Abnormal   Collection Time: 09/27/21  4:08 AM  Result Value Ref Range   Sodium 134 (L) 135 - 145 mmol/L   Potassium 3.7 3.5 - 5.1 mmol/L   Chloride 100 98 - 111 mmol/L   CO2 26 22 - 32 mmol/L   Glucose, Bld 125 (H) 70 - 99 mg/dL    Comment: Glucose reference range applies only to samples taken after fasting for at least 8 hours.   BUN 12 6 - 20 mg/dL   Creatinine, Ser 6.04 (H) 0.61 - 1.24 mg/dL   Calcium 9.2 8.9 - 54.0 mg/dL   GFR, Estimated >98 >11 mL/min    Comment: (NOTE) Calculated using the CKD-EPI Creatinine Equation (2021)     Anion gap 8 5 - 15    Comment: Performed at [facility], [address] [place], [place] [address]  . CT Maxillofacial W Contrast  Result Date: 09/27/2021 CLINICAL DATA:  Left maxillary dental abscess EXAM: CT MAXILLOFACIAL WITH CONTRAST TECHNIQUE: Multidetector CT imaging of the maxillofacial structures was performed with intravenous contrast. Multiplanar CT image reconstructions were also generated. RADIATION DOSE REDUCTION: This exam was performed according to the departmental dose-optimization program which includes automated exposure control, adjustment of the mA and/or kV according to patient size and/or use of iterative reconstruction technique. CONTRAST:  75mL OMNIPAQUE IOHEXOL 300 MG/ML  SOLN COMPARISON:  None Available. FINDINGS: Osseous: Dental caries and periapical erosions. Prior right mandible fracture with fixating plate. Two recent left maxillary extractions with the more anterior showing a retained tooth root measuring 4 mm adjacent to a broad dehiscence along the labial aspect which is complicated by cellulitis with 2.3 cm left cheek abscess. Orbits: No orbital extension of infection. Sinuses: Trace mucosal thickening along the floors of the maxillary sinuses, possibly reactive and odontogenic. The more posterior left maxillary extraction site has an alveolus that appears nearly dehiscent into the left maxillary sinus. Soft tissues: Left cheek cellulitis and abscess as noted above. Limited intracranial: Negative IMPRESSION: Odontogenic cellulitis with 2.3 cm abscess in the left cheek, contiguous with the more anterior dental extraction site which is notable for a 4 mm retained tooth root. Electronically Signed   By: Tiburcio Pea M.D.   On: 09/27/2021 05:11  .   PROCEDURE: Preoperative Diagnosis: Left facial abscess from dental infection  Postoperative Diagnosis: Same Procedure: Needle aspiration and drainage of left facial dental abscess  indications: Left facial abscess  from dental infection  Findings: 4 cc of pus was aspirated from the abscess, substantially reducing the facial swelling and firmness. Description of Procedure: After discussing procedure and risks  (primarily bleeding) with the patient, the left gingivobuccal sulcus was injected with 1% lidocaine with epinephrine 1-100,000.  An 18-gauge needle was then used to aspirate the facial abscess.  2 separate aspirations were performed with a  final aspiration of about 4 cc of purulence.  The patient tolerated the procedure well and noted substantial improvement in pressure and pain after the aspiration.   ASSESSMENT: Left facial abscess secondary to dental infection.  I was able to successfully aspirate the purulence from the abscess.  There is residual induration which should respond to antibiotic therapy with the Unasyn, although if he does not make further progress, further drainage could be performed. PLAN: The abscess was successfully drained by needle aspiration.  Continue Unasyn IV.  [facility] coverage will be available [facility]NT [facility], so if the patient does not make progress would recommend reconsulting ENT via the locum service to consider further drainage.  The patient will need to follow-up as an outpatient with his dentist or an oral surgeon as there appears to be some retained tooth from the prior extraction.  We cannot help with that aspect of his problem and that is likely going to be important to prevent reinfection in the future.  Once improving he could be discharged on clindamycin 300 mg 4 times daily to follow-up with his dentist as an outpatient. Sandi MealyPaul S Glorianne Proctor, MD 09/27/2021 12:25 PM

## 2021-09-27 NOTE — H&P (Addendum)
History and Physical    Patient: Eric Mata TIR:[id number] DOB: 1980/07/25 DOA: 09/27/2021 DOS: the patient was seen and examined on 09/27/2021 PCP: Patient, No Pcp Per (Inactive)  Patient coming from: Home  Chief Complaint:  Chief Complaint  Patient presents with   Facial Swelling   HPI: Eric Mata is a 41 y.o. male with medical history significant for nicotine dependence who presents to the ER for evaluation of swelling and pain involving the left side of his face.  Patient states that he had a dental abscess and was placed on antibiotics by his dentist which he completed.  He is status post extraction of multiple teeth on the upper left maxillary area which was done on 09/23/21.  He states that 24 hours post extraction he developed swelling involving the left side of his face.  He did not think anything of it and attributed it to the recent procedure that he had.  He states that the swelling has significantly worsened  and is associated with pain which he rates an 8 x 10 in intensity at its worst.  He saw his dentist again 24 hours prior to admission who prescribed him penicillin and advised that he go to the ER if his symptoms worsened.  He has taken 2 doses of penicillin so far. He has had fever and chills but was not febrile when he arrived in the ER. He denies having chest pain, no abdominal pain, no nausea, no vomiting, no headache, no dizziness, no lightheadedness, no urinary symptoms, no changes in his bowel habits, no blurred vision or focal deficit.  Review of Systems: As mentioned in the history of present illness. All other systems reviewed and are negative. Past Medical History:  Diagnosis Date   Dental abscess    History reviewed. No pertinent surgical history. Social History:  reports that he has quit smoking. He has never used smokeless tobacco. He reports that he does not drink alcohol and does not use drugs.  No Known Allergies  History reviewed.  No pertinent family history.  Prior to Admission medications   Medication Sig Start Date End Date Taking? Authorizing Provider  acetaminophen-codeine (TYLENOL #3) 300-30 MG tablet Take 1 tablet by mouth every 4 (four) hours as needed for pain. 09/23/21   [provider]  penicillin v potassium (VEETID) 500 MG tablet Take 500 mg by mouth 4 (four) times daily. Patient not taking: Reported on 09/27/2021 09/26/21   [provider]    Physical Exam: Vitals:   09/27/21 0535 09/27/21 0600 09/27/21 0657 09/27/21 0808  BP:  128/72 (!) 141/91 133/90  Pulse: 65 (!) 58 (!) 57 63  Resp:  16 19 16   Temp:   99.3 F (37.4 C) 98.4 F (36.9 C)  TempSrc:    Oral  SpO2: 94% 96% 100% 100%  Height:       Physical Exam Vitals and nursing note reviewed.  Constitutional:      Appearance: He is normal weight.     Comments: Significant swelling involving the left side of his face  HENT:     Head: Normocephalic and atraumatic.     Nose: Nose normal.     Mouth/Throat:     Comments: Obvious left-sided maxillary swelling without overlying skin changes or signs of external trauma.  No signs of EOM entrapment or upper airway obstruction.  No firmness to the floor of the mouth or swelling beneath the tongue. Poor dentition intraorally.  Uvula is midline. Eyes:  Pupils: Pupils are equal, round, and reactive to light.  Cardiovascular:     Rate and Rhythm: Regular rhythm.  Pulmonary:     Effort: Pulmonary effort is normal.     Breath sounds: Normal breath sounds.  Abdominal:     General: Abdomen is flat.     Palpations: Abdomen is soft.  Musculoskeletal:        General: Normal range of motion.     Cervical back: Normal range of motion and neck supple.  Skin:    General: Skin is warm and dry.  Neurological:     General: No focal deficit present.     Mental Status: He is alert and oriented to person, place, and time.  Psychiatric:        Mood and Affect: Mood normal.        Behavior:  Behavior normal.    Data Reviewed: Relevant notes from primary care and specialist visits, past discharge summaries as available in EHR, including Care Everywhere. Prior diagnostic testing as pertinent to current admission diagnoses Updated medications and problem lists for reconciliation ED course, including vitals, labs, imaging, treatment and response to treatment Triage notes, nursing and pharmacy notes and ED provider's notes Notable results as noted in HPI Labs reviewed.  Sodium 134, potassium 3.7, chloride 100, bicarb 26, glucose 125, BUN 12, creatinine 1.29, calcium 9.2, white count 18.6, hemoglobin 14.4, hematocrit 44.1, RDW 13.2, platelet count 240 Maxillofacial CT shows Odontogenic cellulitis with 2.3 cm abscess in the left cheek, contiguous with the more anterior dental extraction site which is notable for a 4 mm retained tooth root. Twelve-lead EKG reviewed by me shows normal sinus rhythm There are no new results to review at this time.  Assessment and Plan: * Facial cellulitis Patient presents for evaluation of left facial swelling related to dental caries and an infected tooth status postextraction Maxillofacial CT shows odontogenic cellulitis with 2.3 cm abscess in the left cheek, contiguous with the more anterior dental extraction site which is notable for a 4 mm retained tooth root. Place patient on IV Unasyn Pain control with Toradol ENT consult  Nicotine dependence Smoking cessation has been discussed with patient in detail We will place patient on a nicotine transdermal patch 14 mg daily      Advance Care Planning:   Code Status: Full Code   Consults: ENT  Family Communication: Greater than 50% of time was spent discussing patient's condition and plan of care with him at the bedside.  All questions and concerns have been addressed.  He verbalizes understanding and agrees to the plan.  Severity of Illness: The appropriate patient status for this patient is  INPATIENT. Inpatient status is judged to be reasonable and necessary in order to provide the required intensity of service to ensure the patient's safety. The patient's presenting symptoms, physical exam findings, and initial radiographic and laboratory data in the context of their chronic comorbidities is felt to place them at high risk for further clinical deterioration. Furthermore, it is not anticipated that the patient will be medically stable for discharge from the hospital within 2 midnights of admission.   * I certify that at the point of admission it is my clinical judgment that the patient will require inpatient hospital care spanning beyond 2 midnights from the point of admission due to high intensity of service, high risk for further deterioration and high frequency of surveillance required.*  Author: Lucile Shutters, MD 09/27/2021 8:39 AM  For on call review [email or web address].

## 2021-09-27 NOTE — ED Notes (Signed)
Patient transported to floor via wheelchair with RN.  All personal belongings transported with patient.

## 2021-09-27 NOTE — Assessment & Plan Note (Addendum)
Still has facial swelling but I am not seeing redness currently

## 2021-09-27 NOTE — ED Triage Notes (Addendum)
Patient ambulatory to triage with steady gait, without difficulty or distress noted; pt with large amount swelling to left side of face and beneath eye that began after dental extraction on Monday; currently taking amoxicillin; st swelling is affecting his breathing; pt noted to have drooling

## 2021-09-27 NOTE — Assessment & Plan Note (Signed)
Smoking cessation has been discussed with patient in detail We will place patient on a nicotine transdermal patch 14 mg daily 

## 2021-09-28 DIAGNOSIS — F17210 Nicotine dependence, cigarettes, uncomplicated: Secondary | ICD-10-CM

## 2021-09-28 DIAGNOSIS — K047 Periapical abscess without sinus: Secondary | ICD-10-CM

## 2021-09-28 DIAGNOSIS — R220 Localized swelling, mass and lump, head: Secondary | ICD-10-CM | POA: Diagnosis not present

## 2021-09-28 LAB — CBC
HCT: 39.4 % (ref 39.0–52.0)
Hemoglobin: 13.2 g/dL (ref 13.0–17.0)
MCH: 30.3 pg (ref 26.0–34.0)
MCHC: 33.5 g/dL (ref 30.0–36.0)
MCV: 90.4 fL (ref 80.0–100.0)
Platelets: 215 10*3/uL (ref 150–400)
RBC: 4.36 MIL/uL (ref 4.22–5.81)
RDW: 12.9 % (ref 11.5–15.5)
WBC: 10.3 10*3/uL (ref 4.0–10.5)
nRBC: 0 % (ref 0.0–0.2)

## 2021-09-28 LAB — BASIC METABOLIC PANEL
Anion gap: 7 (ref 5–15)
BUN: 9 mg/dL (ref 6–20)
CO2: 27 mmol/L (ref 22–32)
Calcium: 9 mg/dL (ref 8.9–10.3)
Chloride: 104 mmol/L (ref 98–111)
Creatinine, Ser: 1.02 mg/dL (ref 0.61–1.24)
GFR, Estimated: >60 mL/min (ref >60)
Glucose, Bld: 91 mg/dL (ref 70–99)
Potassium: 3.6 mmol/L (ref 3.5–5.1)
Sodium: 138 mmol/L (ref 135–145)

## 2021-09-28 LAB — HIV ANTIBODY (ROUTINE TESTING W REFLEX): HIV Screen 4th Generation wRfx: NONREACTIVE

## 2021-09-28 MED ORDER — OXYCODONE HCL 5 MG PO TABS
5.0000 mg | ORAL_TABLET | Freq: Four times a day (QID) | ORAL | Status: DC | PRN
  Administered 2021-09-28 (×2): 5 mg via ORAL
  Filled 2021-09-28 (×2): qty 1

## 2021-09-28 MED ORDER — METHYLPREDNISOLONE SODIUM SUCC 40 MG IJ SOLR
40.0000 mg | Freq: Every day | INTRAMUSCULAR | Status: DC
Start: 2021-09-28 — End: 2021-09-29
  Administered 2021-09-28: 40 mg via INTRAVENOUS
  Filled 2021-09-28: qty 1

## 2021-09-28 MED ORDER — DOCUSATE SODIUM 100 MG PO CAPS
100.0000 mg | ORAL_CAPSULE | Freq: Two times a day (BID) | ORAL | Status: DC | PRN
  Administered 2021-09-28: 100 mg via ORAL
  Filled 2021-09-28: qty 1

## 2021-09-28 NOTE — Progress Notes (Signed)
  Progress Note   Patient: Eric Mata PIR:[id number] DOB: [birth year] DOA: 09/27/2021     1 DOS: the patient was seen and examined on 09/28/2021     Assessment and Plan: * Dental abscess Dental abscess and cheek abscess.  Continue Unasyn.  Peptostreptococcus growing out of culture.  Will need to get back to his dentist or oral surgeon to remove tooth root and probably more teeth.  Facial cellulitis Still has facial swelling but I am not seeing redness currently  Nicotine dependence Nicotine patch.        Subjective: Patient still has some swelling in his left face but pain has decreased a little bit.  Able to eat and wants to go on soft diet.  Came in with dental abscess.  Physical Exam: Vitals:   09/27/21 1521 09/27/21 2017 09/28/21 0510 09/28/21 0840  BP: 134/72 137/81 128/79 119/79  Pulse: 62 (!) 59 (!) 57 (!) 49  Resp: 17 16 16 18   Temp: 98.4 F (36.9 C) 98.5 F (36.9 C) 98.5 F (36.9 C) 97.9 F (36.6 C)  TempSrc:      SpO2: 100% 100% 99% 100%  Weight:      Height:       Physical Exam HENT:     Head: Normocephalic.     Mouth/Throat:     Pharynx: No oropharyngeal exudate.     Comments: Poor dentition.  Some discomfort inside the mouth upper left gum. Eyes:     General: Lids are normal.     Conjunctiva/sclera: Conjunctivae normal.  Cardiovascular:     Rate and Rhythm: Normal rate and regular rhythm.     Heart sounds: Normal heart sounds, S1 normal and S2 normal.  Pulmonary:     Breath sounds: No decreased breath sounds, wheezing, rhonchi or rales.  Abdominal:     Palpations: Abdomen is soft.     Tenderness: There is no abdominal tenderness.  Musculoskeletal:     Right lower leg: No swelling.     Left lower leg: No swelling.  Skin:    General: Skin is warm.     Findings: No rash.  Neurological:     Mental Status: He is alert and oriented to person, place, and time.    Data Reviewed: Creatinine went from 1.29-1.02, white blood cell count went  from 13.6 down to 10.3 Maxillofacial CT scan showed odontogenic cellulitis with 2 to 3 cm abscess in the left cheek continuous with more anterior dental extraction site which is a notable for 4 mm retained tooth root.  Family Communication: Spoke with family at the bedside  Disposition: Status is: Inpatient Remains inpatient appropriate because: Still requiring IV antibiotics.  We will add IV Solu-Medrol to help out with inflammation. Planned Discharge Destination: Home   Author: [id number], MD 09/28/2021 12:42 PM  For on call review [email or web address].

## 2021-09-28 NOTE — Assessment & Plan Note (Addendum)
Dental abscess and cheek abscess.  The patient was on IV Unasyn during the hospital course.  We will switch over to oral Flagyl and Augmentin upon going home with a prednisone taper.  Will need to get back to his dentist and an oral surgeon to remove tooth root and probably more teeth.

## 2021-09-29 DIAGNOSIS — K047 Periapical abscess without sinus: Secondary | ICD-10-CM | POA: Diagnosis not present

## 2021-09-29 DIAGNOSIS — R220 Localized swelling, mass and lump, head: Secondary | ICD-10-CM | POA: Diagnosis not present

## 2021-09-29 DIAGNOSIS — L03211 Cellulitis of face: Secondary | ICD-10-CM | POA: Diagnosis not present

## 2021-09-29 DIAGNOSIS — F17210 Nicotine dependence, cigarettes, uncomplicated: Secondary | ICD-10-CM | POA: Diagnosis not present

## 2021-09-29 MED ORDER — SODIUM CHLORIDE 0.9 % IV SOLN
3.0000 g | Freq: Four times a day (QID) | INTRAVENOUS | Status: DC
  Administered 2021-09-29 – 2021-09-30 (×5): 3 g via INTRAVENOUS
  Filled 2021-09-29: qty 8
  Filled 2021-09-29 (×2): qty 3
  Filled 2021-09-29 (×2): qty 8
  Filled 2021-09-29: qty 3

## 2021-09-29 MED ORDER — OXYCODONE HCL 5 MG PO TABS: 5.0000 mg | ORAL_TABLET | Freq: Four times a day (QID) | ORAL | 0 refills | Status: DC | PRN

## 2021-09-29 MED ORDER — NICOTINE 14 MG/24HR TD PT24
MEDICATED_PATCH | TRANSDERMAL | 0 refills | Status: AC
Start: 2021-09-29 — End: ?

## 2021-09-29 MED ORDER — PREDNISONE 20 MG PO TABS
40.0000 mg | ORAL_TABLET | Freq: Every day | ORAL | Status: DC
  Administered 2021-09-29: 40 mg via ORAL
  Filled 2021-09-29 (×2): qty 2

## 2021-09-29 MED ORDER — METRONIDAZOLE 500 MG PO TABS: 500.0000 mg | ORAL_TABLET | Freq: Two times a day (BID) | ORAL | 0 refills | Status: AC

## 2021-09-29 MED ORDER — METHYLPREDNISOLONE SODIUM SUCC 40 MG IJ SOLR
40.0000 mg | Freq: Two times a day (BID) | INTRAMUSCULAR | Status: DC
Start: 2021-09-29 — End: 2021-09-30
  Administered 2021-09-29 – 2021-09-30 (×2): 40 mg via INTRAVENOUS
  Filled 2021-09-29 (×2): qty 1

## 2021-09-29 MED ORDER — METRONIDAZOLE 500 MG PO TABS: 500.0000 mg | ORAL_TABLET | Freq: Two times a day (BID) | ORAL | Status: DC

## 2021-09-29 MED ORDER — ACETAMINOPHEN 325 MG PO TABS
650.0000 mg | ORAL_TABLET | Freq: Four times a day (QID) | ORAL | Status: AC | PRN
Start: 2021-09-29 — End: ?

## 2021-09-29 MED ORDER — AMOXICILLIN-POT CLAVULANATE 875-125 MG PO TABS
1.0000 | ORAL_TABLET | Freq: Two times a day (BID) | ORAL | Status: DC
  Administered 2021-09-29: 1 via ORAL
  Filled 2021-09-29: qty 1

## 2021-09-29 MED ORDER — METRONIDAZOLE 500 MG PO TABS
500.0000 mg | ORAL_TABLET | Freq: Two times a day (BID) | ORAL | Status: DC
  Administered 2021-09-29: 500 mg via ORAL
  Filled 2021-09-29: qty 1

## 2021-09-29 MED ORDER — PREDNISONE 10 MG PO TABS: ORAL_TABLET | ORAL | 0 refills | Status: DC

## 2021-09-29 MED ORDER — AMOXICILLIN-POT CLAVULANATE 875-125 MG PO TABS: 1.0000 | ORAL_TABLET | Freq: Two times a day (BID) | ORAL | 0 refills | Status: AC

## 2021-09-29 NOTE — TOC CM/SW Note (Signed)
  Transition of Care [facility]) Screening Note   Patient Details  Name: Eric Mata Date of Birth: February 01, 1981   Transition of Care [facility]) CM/SW Contact:    Liliana Cline, LCSW Phone Number: 09/29/2021, 9:18 AM    Transition of Care Department [facility]) has reviewed patient and no TOC needs have been identified at this time. We will continue to monitor patient advancement through interdisciplinary progression rounds. If new patient transition needs arise, please place a TOC consult.

## 2021-09-29 NOTE — Progress Notes (Signed)
Pharmacy Antibiotic Note  Anguel Delapena Ginsberg is a 41 y.o. male admitted on 09/27/2021 with  tooth infection .  Pharmacy has been consulted for Unasyn dosing.  D3 ABX. Renal function stable. Originally planned to discharge, now cancelled. Transitioning back to Unasyn.  Plan: Unasyn (ampicillin/sulbactam) 3 grams every 6 hours  Height: 6\' 1"  (185.4 cm) Weight: 71.3 kg (157 lb 1.6 oz) IBW/kg (Calculated) : 79.9  Temp (24hrs), Avg:98 F (36.7 C), Min:97.7 F (36.5 C), Max:98.4 F (36.9 C)  Recent Labs  Lab 09/27/21 0408 09/28/21 0420  WBC 13.6* 10.3  CREATININE 1.29* 1.02    Estimated Creatinine Clearance: 98.1 mL/min (by C-G formula based on SCr of 1.02 mg/dL).    No Known Allergies  Antimicrobials this admission: Unasyn 5/26 >> 5/27  Augmentin 5/28 >> 5/28  Dose adjustments this admission: N/a   Thank you for allowing pharmacy to be a part of this patient's care.  6/28, PharmD Pharmacy Resident  09/29/2021 10:50 AM

## 2021-09-29 NOTE — Progress Notes (Signed)
  Progress Note   Patient: Eric Mata YQM:[id number] DOB: 1980-07-26 DOA: 09/27/2021     2 DOS: the patient was seen and examined on 09/29/2021     Assessment and Plan: * Dental abscess Dental abscess and cheek abscess.  Patient lost IV access this morning I gave Augmentin and Flagyl.  The patient changed his mind and wanted to do IV antibiotics today to get the swelling down further.  IV placed.  Changed back to IV Unasyn.  Will need to get back to his dentist and an oral surgeon to remove tooth root and probably more teeth.  Facial cellulitis Still has facial swelling but I am not seeing redness currently  Nicotine dependence Nicotine patch.        Subjective: The patient lost IV access and I went to see him early this morning.  Initially he was wanting to go home on oral antibiotics and following up as outpatient.  Later in the morning he changed his mind and wanted to continue IV antibiotics.  Still has a little discomfort in his left face.  Left facial swelling down from yesterday.  Admitted with dental abscess.  Physical Exam: Vitals:   09/28/21 1556 09/28/21 2057 09/29/21 0427 09/29/21 0751  BP: (!) 142/96 128/90 105/68 104/73  Pulse: 65 (!) 51 (!) 50 (!) 49  Resp: 18  16 14   Temp: 98.4 F (36.9 C) 97.8 F (36.6 C) 98 F (36.7 C) 97.7 F (36.5 C)  TempSrc:  Oral  Oral  SpO2: 97% 100% 95% 99%  Weight:      Height:       Physical Exam HENT:     Head: Normocephalic.     Mouth/Throat:     Pharynx: No oropharyngeal exudate.     Comments: Poor dentition.  Some discomfort inside the mouth upper left gum.  Left facial swelling improved from yesterday but still little swollen. Eyes:     General: Lids are normal.     Conjunctiva/sclera: Conjunctivae normal.  Cardiovascular:     Rate and Rhythm: Normal rate and regular rhythm.     Heart sounds: Normal heart sounds, S1 normal and S2 normal.  Pulmonary:     Breath sounds: No decreased breath sounds, wheezing,  rhonchi or rales.  Abdominal:     Palpations: Abdomen is soft.     Tenderness: There is no abdominal tenderness.  Musculoskeletal:     Right lower leg: No swelling.     Left lower leg: No swelling.  Skin:    General: Skin is warm.     Findings: No rash.  Neurological:     Mental Status: He is alert and oriented to person, place, and time.    Data Reviewed: No new data  Family Communication: Permission to speak in front of family at bedside  Disposition: Status is: Inpatient Remains inpatient appropriate because: We will continue IV Solu-Medrol and IV Unasyn for today and reassess tomorrow. Planned Discharge Destination: Home   Author: , MD 09/29/2021 1:57 PM  For on call review [email or web address].

## 2021-09-30 DIAGNOSIS — F17210 Nicotine dependence, cigarettes, uncomplicated: Secondary | ICD-10-CM | POA: Diagnosis not present

## 2021-09-30 DIAGNOSIS — K047 Periapical abscess without sinus: Secondary | ICD-10-CM | POA: Diagnosis not present

## 2021-09-30 DIAGNOSIS — R220 Localized swelling, mass and lump, head: Secondary | ICD-10-CM | POA: Diagnosis not present

## 2021-09-30 DIAGNOSIS — L03211 Cellulitis of face: Secondary | ICD-10-CM | POA: Diagnosis not present

## 2021-09-30 NOTE — Progress Notes (Signed)
Received Md order to discharge patient to home, reviewed discharge instructions, home meds, prescriptions and follow up appointments with patient and patient verbalized understanding.     

## 2021-09-30 NOTE — Discharge Summary (Signed)
Physician Discharge Summary   Patient: Eric Mata MRN: [id number] DOB: Jan 05, 1981  Admit date:     09/27/2021  Discharge date: 09/30/21  Discharge Physician: Alford Highland   PCP: Patient, No Pcp Per (Inactive)   Recommendations at discharge:   Follow-up with your medical doctor in 5 days Recommend following up with a oral maxillofacial surgeon to remove tooth roots.  Discharge Diagnoses: Principal Problem:   Dental abscess Active Problems:   Facial cellulitis   Nicotine dependence   Facial swelling   Left facial swelling   Hospital Course: 41 year old man with poor dentition presents to the hospital with left facial pain.  He stated he had some teeth that were pulled by his dentist and then he developed some pain and swelling.  He had a CT scan that showed an altered optic cellulitis with 2.3 cm abscess in the left cheek contiguous with the more anterior dental extraction site which is notable for a 4 mm retained tooth root.  ENT was consulted and they were able to drain out some pus.  The patient was placed on IV Unasyn.  I added some steroids and then the swelling started to come down.  Will discharge home on Augmentin and Flagyl for another 12 days with a prednisone taper.  Assessment and Plan: * Dental abscess Dental abscess and cheek abscess.  The patient was on IV Unasyn during the hospital course.  We will switch over to oral Flagyl and Augmentin upon going home with a prednisone taper.  Will need to get back to his dentist and an oral surgeon to remove tooth root and probably more teeth.  Facial cellulitis I have not seen any redness on his cheek.  Facial swelling improved since coming in.  Nicotine dependence Nicotine patch.         Consultants: ENT Procedures performed: Abscess drainage Disposition: Home Diet recommendation:  Soft diet DISCHARGE MEDICATION: Allergies as of 09/30/2021   No Known Allergies      Medication List     STOP  taking these medications    acetaminophen-codeine 300-30 MG tablet Commonly known as: TYLENOL #3       TAKE these medications    acetaminophen 325 MG tablet Commonly known as: TYLENOL Take 2 tablets (650 mg total) by mouth every 6 (six) hours as needed for mild pain (or Fever >/= 101).   amoxicillin-clavulanate 875-125 MG tablet Commonly known as: AUGMENTIN Take 1 tablet by mouth every 12 (twelve) hours for 12 days.   metroNIDAZOLE 500 MG tablet Commonly known as: FLAGYL Take 1 tablet (500 mg total) by mouth every 12 (twelve) hours for 12 days.   nicotine 14 mg/24hr patch Commonly known as: NICODERM CQ - dosed in mg/24 hours 14 mg patch chest wall daily (okay to substitute generic)   oxyCODONE 5 MG immediate release tablet Commonly known as: Oxy IR/ROXICODONE Take 1 tablet (5 mg total) by mouth every 6 (six) hours as needed for moderate pain.   predniSONE 10 MG tablet Commonly known as: DELTASONE 4 tabs po day1; 3 tabs po day2; 2 tabs po day3,4; 1 tab po day5,6; 1/2 tab po 7,8        Follow-up Information     Your medical doctor Follow up in 5 day(s).          [facility] Follow up.   Why: Call [phone] to schedule appointment               Discharge Exam:  Filed Weights   09/27/21 0901  Weight: 71.3 kg   Physical Exam HENT:     Head: Normocephalic.     Mouth/Throat:     Pharynx: No oropharyngeal exudate.     Comments: Poor dentition.  Less discomfort inside the mouth to palpation.  Left facial swelling continues to improve.  Slight area of fullness in the left cheek. Eyes:     General: Lids are normal.     Conjunctiva/sclera: Conjunctivae normal.  Cardiovascular:     Rate and Rhythm: Normal rate and regular rhythm.     Heart sounds: Normal heart sounds, S1 normal and S2 normal.  Pulmonary:     Breath sounds: No decreased breath sounds, wheezing, rhonchi or rales.  Abdominal:     Palpations: Abdomen is soft.      Tenderness: There is no abdominal tenderness.  Musculoskeletal:     Right lower leg: No swelling.     Left lower leg: No swelling.  Skin:    General: Skin is warm.     Findings: No rash.  Neurological:     Mental Status: He is alert and oriented to person, place, and time.     Condition at discharge: stable  The results of significant diagnostics from this hospitalization (including imaging, microbiology, ancillary and laboratory) are listed below for reference.   Imaging Studies: CT Maxillofacial W Contrast  Result Date: 09/27/2021 CLINICAL DATA:  Left maxillary dental abscess EXAM: CT MAXILLOFACIAL WITH CONTRAST TECHNIQUE: Multidetector CT imaging of the maxillofacial structures was performed with intravenous contrast. Multiplanar CT image reconstructions were also generated. RADIATION DOSE REDUCTION: This exam was performed according to the departmental dose-optimization program which includes automated exposure control, adjustment of the mA and/or kV according to patient size and/or use of iterative reconstruction technique. CONTRAST:  75mL OMNIPAQUE IOHEXOL 300 MG/ML  SOLN COMPARISON:  None Available. FINDINGS: Osseous: Dental caries and periapical erosions. Prior right mandible fracture with fixating plate. Two recent left maxillary extractions with the more anterior showing a retained tooth root measuring 4 mm adjacent to a broad dehiscence along the labial aspect which is complicated by cellulitis with 2.3 cm left cheek abscess. Orbits: No orbital extension of infection. Sinuses: Trace mucosal thickening along the floors of the maxillary sinuses, possibly reactive and odontogenic. The more posterior left maxillary extraction site has an alveolus that appears nearly dehiscent into the left maxillary sinus. Soft tissues: Left cheek cellulitis and abscess as noted above. Limited intracranial: Negative IMPRESSION: Odontogenic cellulitis with 2.3 cm abscess in the left cheek, contiguous  with the more anterior dental extraction site which is notable for a 4 mm retained tooth root. Electronically Signed   By: Tiburcio PeaJonathan  Watts M.D.   On: 09/27/2021 05:11    Microbiology: Results for orders placed or performed during the hospital encounter of 04/17/20  Aerobic/Anaerobic Culture (surgical/deep wound)     Status: None   Collection Time: 04/17/20 11:51 PM   Specimen: Abscess  Result Value Ref Range Status   Specimen Description   Final    ABSCESS Performed at [facility], [address] [place], [place] [address]    Special Requests   Final    ABSCESS Performed at [facility], [address] [place], [place] [address]    Gram Stain   Final    ABUNDANT WBC PRESENT, PREDOMINANTLY PMN NO ORGANISMS SEEN Performed at [facility], [address] [place], [place] [address]    Culture RARE PEPTOSTREPTOCOCCUS MICROS  Final  Report Status 04/24/2020 FINAL  Final    Labs: CBC: Recent Labs  Lab 09/27/21 0408 09/28/21 0420  WBC 13.6* 10.3  NEUTROABS 7.8*  --   HGB 14.4 13.2  HCT 44.1 39.4  MCV 93.0 90.4  PLT 240 215   Basic Metabolic Panel: Recent Labs  Lab 09/27/21 0408 09/28/21 0420  NA 134* 138  K 3.7 3.6  CL 100 104  CO2 26 27  GLUCOSE 125* 91  BUN 12 9  CREATININE 1.29* 1.02  CALCIUM 9.2 9.0     Discharge time spent: greater than 30 minutes.  Signed: Alford Highland, MD [facility] 09/30/2021

## 2021-11-12 ENCOUNTER — Ambulatory Visit: Payer: No Typology Code available for payment source | Admitting: Nurse Practitioner

## 2022-08-28 ENCOUNTER — Encounter: Payer: Self-pay | Admitting: Emergency Medicine

## 2022-08-28 ENCOUNTER — Ambulatory Visit
Admission: EM | Admit: 2022-08-28 | Discharge: 2022-08-28 | Disposition: A | Discharge status: Home / Self Care | Payer: No Typology Code available for payment source | Attending: Family Medicine | Admitting: Family Medicine

## 2022-08-28 DIAGNOSIS — R21 Rash and other nonspecific skin eruption: Secondary | ICD-10-CM | POA: Diagnosis not present

## 2022-08-28 MED ORDER — PREDNISONE 10 MG (21) PO TBPK
ORAL_TABLET | Freq: Every day | ORAL | 0 refills | Status: AC
Start: 2022-08-28 — End: ?

## 2022-08-28 MED ORDER — TRIAMCINOLONE ACETONIDE 0.1 % EX OINT: 1.0000 | TOPICAL_OINTMENT | Freq: Four times a day (QID) | CUTANEOUS | 0 refills | Status: AC

## 2022-08-28 NOTE — Discharge Instructions (Signed)
Stop by the pharmacy to pick up your prescriptions.  Follow up with your primary care provider as needed.  

## 2022-08-28 NOTE — ED Triage Notes (Signed)
Pt presents with ?insect bites on bilateral arms x 5 days. Pt states he was in [place] and when he initially noticed the bumps.

## 2022-08-28 NOTE — ED Provider Notes (Signed)
[facility]    CSN: [id number] Arrival date & time: 08/28/22  1542      History   Chief Complaint Chief Complaint  Patient presents with   Insect Bite    HPI Eric Mata is a 42 y.o. male.   HPI  Eric Mata presents for concern for bug bites. He started itching while in an [employer]nb while on a business trip in [place]. He continues to scratch after returning home. The rash is on his legs and arms.  At times, his back feels itchy.  No one at home has a similar rash.  He did not see any bugs or insects.  No treatments prior to arrival.  There is been no fever, blurry vision, joint pain, neck pain or headache.  He has otherwise been in his normal state of health.     Past Medical History:  Diagnosis Date   Dental abscess     Patient Active Problem List   Diagnosis Date Noted   Dental abscess 09/28/2021   Facial swelling    Left facial swelling    Facial cellulitis 09/27/2021   Nicotine dependence 09/27/2021    History reviewed. No pertinent surgical history.     Home Medications    Prior to Admission medications   Medication Sig Start Date End Date Taking? Authorizing Provider  predniSONE (STERAPRED UNI-PAK 21 TAB) 10 MG (21) TBPK tablet Take by mouth daily. Take 6 tabs by mouth daily for 1, then 5 tabs for 1 day, then 4 tabs for 1 day, then 3 tabs for 1 day, then 2 tabs for 1 day, then 1 tab for 1 day. 08/28/22  Yes Landri Dorsainvil, DO  triamcinolone ointment (KENALOG) 0.1 % Apply 1 Application topically 4 (four) times daily. 08/28/22  Yes Alexyia Guarino, Seward Meth, DO  acetaminophen (TYLENOL) 325 MG tablet Take 2 tablets (650 mg total) by mouth every 6 (six) hours as needed for mild pain (or Fever >/= 101). 09/29/21   Alford Highland, MD  nicotine (NICODERM CQ - DOSED IN MG/24 HOURS) 14 mg/24hr patch 14 mg patch chest wall daily (okay to substitute generic) 09/29/21   Alford Highland, MD    Family History No family history on file.  Social History Social  History   Tobacco Use   Smoking status: Every Day    Types: Cigarettes   Smokeless tobacco: Never  Vaping Use   Vaping Use: Never used  Substance Use Topics   Alcohol use: Never   Drug use: Never     Allergies   Patient has no known allergies.   Review of Systems Review of Systems :negative unless otherwise stated in HPI.      Physical Exam Triage Vital Signs ED Triage Vitals  Enc Vitals Group     BP 08/28/22 1611 122/80     Pulse Rate 08/28/22 1611 (!) 57     Resp 08/28/22 1611 16     Temp 08/28/22 1611 98.2 F (36.8 C)     Temp Source 08/28/22 1611 Oral     SpO2 08/28/22 1611 99 %     Weight --      Height --      Head Circumference --      Peak Flow --      Pain Score 08/28/22 1610 0     Pain Loc --      Pain Edu? --      Excl. in GC? --    No data found.  Updated Vital Signs BP  122/80 (BP Location: Left Arm)   Pulse (!) 57   Temp 98.2 F (36.8 C) (Oral)   Resp 16   SpO2 99%   Visual Acuity Right Eye Distance:   Left Eye Distance:   Bilateral Distance:    Right Eye Near:   Left Eye Near:    Bilateral Near:     Physical Exam  GEN: alert, well appearing male, in no acute distress  EYES: extra occular movements intact, no scleral injection CV: regular rate and rhythm RESP: no increased work of breathing, clear to ascultation bilaterally MSK: no extremity edema, no gross deformities NEURO: alert, moves all extremities appropriately, normal gait PSYCH: Normal affect, appropriate speech and behavior  SKIN: warm and dry; diffuse erythematous papules on extremities, abdomen and back  UC Treatments / Results  Labs (all labs ordered are listed, but only abnormal results are displayed) Labs Reviewed - No data to display  EKG   Radiology No results found.  Procedures Procedures (including critical care time)  Medications Ordered in UC Medications - No data to display  Initial Impression / Assessment and Plan / UC Course  I have  reviewed the triage vital signs and the nursing notes.  Pertinent labs & imaging results that were available during my care of the patient were reviewed by me and considered in my medical decision making (see chart for details).     Patient is a 42 y.o. Bonnita Nasuti presents for rash.  Overall, patient is well-appearing and well-hydrated.  Vital signs stable.  Eric Mata is afebrile.  Exam concerning for bug bites versus contact dermatitis.  Treat with steroid taper and ointment.  No sign of infection to suggest antibiotics at this time.  Advised patient to check his luggage and furniture for bedbugs.  Areas of concern sparing the wrist and hands therefore doubt scabies at this time.  Reviewed expectations regarding course of current medical issues.  All questions asked were answered.  Outlined signs and symptoms indicating need for more acute intervention. Patient verbalized understanding. After Visit Summary given.   Final Clinical Impressions(s) / UC Diagnoses   Final diagnoses:  Rash     Discharge Instructions      Stop by the pharmacy to pick up your prescriptions.  Follow up with your primary care provider as needed.      ED Prescriptions     Medication Sig Dispense Auth. Provider   predniSONE (STERAPRED UNI-PAK 21 TAB) 10 MG (21) TBPK tablet Take by mouth daily. Take 6 tabs by mouth daily for 1, then 5 tabs for 1 day, then 4 tabs for 1 day, then 3 tabs for 1 day, then 2 tabs for 1 day, then 1 tab for 1 day. 21 tablet Karolina Zamor, DO   triamcinolone ointment (KENALOG) 0.1 % Apply 1 Application topically 4 (four) times daily. 30 g Katha Cabal, DO      PDMP not reviewed this encounter.              Katha Cabal, DO 09/02/22 2050

## 2022-09-02 ENCOUNTER — Encounter: Payer: Self-pay | Admitting: Family Medicine

## 2022-11-06 ENCOUNTER — Ambulatory Visit
Admission: EM | Admit: 2022-11-06 | Discharge: 2022-11-06 | Disposition: A | Discharge status: Home / Self Care | Payer: No Typology Code available for payment source | Attending: Emergency Medicine | Admitting: Emergency Medicine

## 2022-11-06 ENCOUNTER — Encounter: Payer: Self-pay | Admitting: Emergency Medicine

## 2022-11-06 DIAGNOSIS — U071 COVID-19: Secondary | ICD-10-CM | POA: Diagnosis present

## 2022-11-06 DIAGNOSIS — J029 Acute pharyngitis, unspecified: Secondary | ICD-10-CM | POA: Insufficient documentation

## 2022-11-06 DIAGNOSIS — R52 Pain, unspecified: Secondary | ICD-10-CM | POA: Insufficient documentation

## 2022-11-06 DIAGNOSIS — R110 Nausea: Secondary | ICD-10-CM | POA: Diagnosis present

## 2022-11-06 LAB — SARS CORONAVIRUS 2 BY RT PCR: SARS Coronavirus 2 by RT PCR: POSITIVE — AB

## 2022-11-06 LAB — GROUP A STREP BY PCR: Group A Strep by PCR: NOT DETECTED

## 2022-11-06 MED ORDER — NAPROXEN 500 MG PO TABS: 500.0000 mg | ORAL_TABLET | Freq: Two times a day (BID) | ORAL | 0 refills | Status: AC | PRN

## 2022-11-06 MED ORDER — ONDANSETRON 4 MG PO TBDP
4.0000 mg | ORAL_TABLET | Freq: Once | ORAL | Status: AC
  Administered 2022-11-06: 4 mg via ORAL

## 2022-11-06 MED ORDER — KETOROLAC TROMETHAMINE 30 MG/ML IJ SOLN
30.0000 mg | Freq: Once | INTRAMUSCULAR | Status: AC
  Administered 2022-11-06: 30 mg via INTRAMUSCULAR

## 2022-11-06 MED ORDER — ONDANSETRON 4 MG PO TBDP: 4.0000 mg | ORAL_TABLET | Freq: Three times a day (TID) | ORAL | 0 refills | Status: AC | PRN

## 2022-11-06 MED ORDER — PROMETHAZINE-DM 6.25-15 MG/5ML PO SYRP: 5.0000 mL | ORAL_SOLUTION | Freq: Four times a day (QID) | ORAL | 0 refills | Status: AC | PRN

## 2022-11-06 NOTE — ED Triage Notes (Signed)
Pt c/o vomiting, headache, fever and bodyaches that started yesterday.

## 2022-11-06 NOTE — Discharge Instructions (Signed)
Your COVID test was positive.You were given a Toradol injection in clinic today. Do not take any over the counter NSAID's such as Advil, ibuprofen, Aleve, or naproxen for 24 hours.  You may take tylenol if needed Start naproxen twice daily as needed tomorrow, 7/5 You may take Zofran every 8 hours as needed for nausea or vomiting. You may take Promethazine DM as needed for your cough 4 times a day.  Please of this medication can make you drowsy.  Do not drink alcohol or drive on this medication.  Lots of rest and fluids.  Please follow-up with your PCP if your symptoms or not improving.  Please go to the emergency room for any worsening symptoms.  I hope you feel better soon!

## 2022-11-06 NOTE — ED Provider Notes (Addendum)
[facility]    CSN: [id number] Arrival date & time: 11/06/22  0820      History   Chief Complaint Chief Complaint  Patient presents with   Emesis   Headache   Fever   Fatigue    HPI Eric Mata is a 42 y.o. male  presents for evaluation of URI symptoms for 1 days. Patient reports associated symptoms of cough, congestion, headache, body aches, chills, nausea and vomiting. Denies diarrhea, ear pain, shortness of breath. Patient does not have a hx of asthma or smoking. No known sick contacts.  pt has taken nothing OTC for symptoms. Pt has no other concerns at this time.    Emesis Associated symptoms: chills, cough, headaches, myalgias and sore throat   Headache Associated symptoms: congestion, cough, myalgias, nausea, sore throat and vomiting   Fever Associated symptoms: chills, congestion, cough, headaches, myalgias, nausea, sore throat and vomiting     Past Medical History:  Diagnosis Date   Dental abscess     Patient Active Problem List   Diagnosis Date Noted   Dental abscess 09/28/2021   Facial swelling    Left facial swelling    Facial cellulitis 09/27/2021   Nicotine dependence 09/27/2021    History reviewed. No pertinent surgical history.     Home Medications    Prior to Admission medications   Medication Sig Start Date End Date Taking? Authorizing Provider  naproxen (NAPROSYN) 500 MG tablet Take 1 tablet (500 mg total) by mouth 2 (two) times daily as needed (body aches/headache). 11/07/22  Yes Radford Pax, NP  ondansetron (ZOFRAN-ODT) 4 MG disintegrating tablet Take 1 tablet (4 mg total) by mouth every 8 (eight) hours as needed for nausea or vomiting. 11/06/22  Yes Radford Pax, NP  promethazine-dextromethorphan (PROMETHAZINE-DM) 6.25-15 MG/5ML syrup Take 5 mLs by mouth 4 (four) times daily as needed for cough. 11/06/22  Yes Radford Pax, NP  acetaminophen (TYLENOL) 325 MG tablet Take 2 tablets (650 mg total) by mouth every 6 (six)  hours as needed for mild pain (or Fever >/= 101). 09/29/21   Alford Highland, MD  nicotine (NICODERM CQ - DOSED IN MG/24 HOURS) 14 mg/24hr patch 14 mg patch chest wall daily (okay to substitute generic) 09/29/21   Renae Gloss, Richard, MD  predniSONE (STERAPRED UNI-PAK 21 TAB) 10 MG (21) TBPK tablet Take by mouth daily. Take 6 tabs by mouth daily for 1, then 5 tabs for 1 day, then 4 tabs for 1 day, then 3 tabs for 1 day, then 2 tabs for 1 day, then 1 tab for 1 day. 08/28/22   Katha Cabal, DO  triamcinolone ointment (KENALOG) 0.1 % Apply 1 Application topically 4 (four) times daily. 08/28/22   Katha Cabal, DO    Family History No family history on file.  Social History Social History   Tobacco Use   Smoking status: Former    Types: Cigarettes   Smokeless tobacco: Never  Vaping Use   Vaping Use: Never used  Substance Use Topics   Alcohol use: Never   Drug use: Yes    Types: Marijuana     Allergies   Patient has no known allergies.   Review of Systems Review of Systems  Constitutional:  Positive for chills.  HENT:  Positive for congestion and sore throat.   Respiratory:  Positive for cough.   Gastrointestinal:  Positive for nausea and vomiting.  Musculoskeletal:  Positive for myalgias.  Neurological:  Positive for headaches.  Physical Exam Triage Vital Signs ED Triage Vitals  Enc Vitals Group     BP 11/06/22 0826 (!) 155/93     Pulse Rate 11/06/22 0826 94     Resp 11/06/22 0826 18     Temp 11/06/22 0826 100 F (37.8 C)     Temp Source 11/06/22 0826 Oral     SpO2 11/06/22 0826 95 %     Weight --      Height --      Head Circumference --      Peak Flow --      Pain Score 11/06/22 0825 7     Pain Loc --      Pain Edu? --      Excl. in GC? --    No data found.  Updated Vital Signs BP (!) 155/93 (BP Location: Right Arm)   Pulse 94   Temp 100 F (37.8 C) (Oral)   Resp 18   SpO2 95%   Visual Acuity Right Eye Distance:   Left Eye Distance:   Bilateral  Distance:    Right Eye Near:   Left Eye Near:    Bilateral Near:     Physical Exam Vitals and nursing note reviewed.  Constitutional:      General: He is not in acute distress.    Appearance: Normal appearance. He is not ill-appearing or toxic-appearing.     Comments: Patient is crying secondary to his body aches  HENT:     Head: Normocephalic and atraumatic.     Right Ear: Tympanic membrane and ear canal normal.     Left Ear: Tympanic membrane and ear canal normal.     Nose: Congestion present.     Mouth/Throat:     Mouth: Mucous membranes are moist.     Pharynx: Posterior oropharyngeal erythema present.  Eyes:     Pupils: Pupils are equal, round, and reactive to light.  Cardiovascular:     Rate and Rhythm: Normal rate and regular rhythm.     Heart sounds: Normal heart sounds.  Pulmonary:     Effort: Pulmonary effort is normal.     Breath sounds: Normal breath sounds.  Musculoskeletal:     Cervical back: Normal range of motion and neck supple.  Lymphadenopathy:     Cervical: No cervical adenopathy.  Skin:    General: Skin is warm and dry.  Neurological:     General: No focal deficit present.     Mental Status: He is alert and oriented to person, place, and time.     GCS: GCS eye subscore is 4. GCS verbal subscore is 5. GCS motor subscore is 6.     Cranial Nerves: No facial asymmetry.     Motor: No weakness.  Psychiatric:        Mood and Affect: Mood normal.        Behavior: Behavior normal.      UC Treatments / Results  Labs (all labs ordered are listed, but only abnormal results are displayed) Labs Reviewed  SARS CORONAVIRUS 2 BY RT PCR - Abnormal; Notable for the following components:      Result Value   SARS Coronavirus 2 by RT PCR POSITIVE (*)    All other components within normal limits  GROUP A STREP BY PCR   tains abnormal data Comprehensive Metabolic Panel Order: [id number] Component Ref Range & Units 1 mo ago  Sodium 135 - 145 mmol/L 142   Potassium 3.4 - 4.8 mmol/L 3.8  Chloride  98 - 107 mmol/L 110 High   CO2 20.0 - 31.0 mmol/L 26.0  Anion Gap 5 - 14 mmol/L 6  BUN 9 - 23 mg/dL 12  Creatinine 8.11 - 9.14 mg/dL 7.82  BUN/Creatinine Ratio 10  eGFR CKD-EPI (2021) Male >=60 mL/min/1.63m2 81  Comment: eGFR calculated with CKD-EPI 2021 equation in accordance with [employer] and [employer] of Nephrology Task Force recommendations.  Glucose 70 - 179 mg/dL 82  Calcium 8.7 - 95.6 mg/dL 9.6  Albumin 3.4 - 5.0 g/dL 4.1  Total Protein 5.7 - 8.2 g/dL 7.0  Total Bilirubin 0.3 - 1.2 mg/dL 0.5  AST <=21 U/L 20  ALT 10 - 49 U/L 23  Alkaline Phosphatase 46 - 116 U/L 72  Resulting Agency [facility]   Specimen Collected: 09/09/22 14:06   Performed by: [facility] Last Resulted: 09/09/22 21:40   EKG   Radiology No results found.  Procedures Procedures (including critical care time)  Medications Ordered in UC Medications  ketorolac (TORADOL) 30 MG/ML injection 30 mg (30 mg Intramuscular Given 11/06/22 0850)  ondansetron (ZOFRAN-ODT) disintegrating tablet 4 mg (4 mg Oral Given 11/06/22 0850)    Initial Impression / Assessment and Plan / UC Course  I have reviewed the triage vital signs and the nursing notes.  Pertinent labs & imaging results that were available during my care of the patient were reviewed by me and considered in my medical decision making (see chart for details).     Patient given Zofran ODT as well as IM Toradol in clinic for his body aches/nausea.  Patient reported improvement of symptoms after this treatment.  Negative strep PCR, positive COVID PCR.  Discussed viral illness and symptomatic treatment.  States he has been vaccinated for COVID in the past and has not had COVID as far as he knows.  Patient does not have any comorbidities qualifying for Paxlovid.  Promethazine DM as needed for cough.  Zofran as needed for nausea and  vomiting.  May start naproxen tomorrow, 7/5's as needed for body aches or fever.  Discussed rest and fluids.  PCP follow-up if symptoms do not improve.  ER precautions reviewed and patient verbalized understanding. Final Clinical Impressions(s) / UC Diagnoses   Final diagnoses:  Sore throat  Nausea  Generalized body aches  COVID-19     Discharge Instructions      Your COVID test was positive.You were given a Toradol injection in clinic today. Do not take any over the counter NSAID's such as Advil, ibuprofen, Aleve, or naproxen for 24 hours.  You may take tylenol if needed Start naproxen twice daily as needed tomorrow, 7/5 You may take Zofran every 8 hours as needed for nausea or vomiting. You may take Promethazine DM as needed for your cough 4 times a day.  Please of this medication can make you drowsy.  Do not drink alcohol or drive on this medication.  Lots of rest and fluids.  Please follow-up with your PCP if your symptoms or not improving.  Please go to the emergency room for any worsening symptoms.  I hope you feel better soon!      ED Prescriptions     Medication Sig Dispense Auth. Provider   promethazine-dextromethorphan (PROMETHAZINE-DM) 6.25-15 MG/5ML syrup Take 5 mLs by mouth 4 (four) times daily as needed for cough. 118 mL Radford Pax, NP   naproxen (NAPROSYN) 500 MG tablet Take 1 tablet (500 mg total) by mouth 2 (two) times daily as  needed (body aches/headache). 14 tablet Radford Pax, NP   ondansetron (ZOFRAN-ODT) 4 MG disintegrating tablet Take 1 tablet (4 mg total) by mouth every 8 (eight) hours as needed for nausea or vomiting. 20 tablet Radford Pax, NP      PDMP not reviewed this encounter.   Radford Pax, NP 11/06/22 0931    Radford Pax, NP 11/06/22 [phone]
